# Patient Record
Sex: Female | Born: 1955 | Race: White | Hispanic: No | Marital: Married | Smoking: Current every day smoker
Health system: Southern US, Community
[De-identification: ages and names within clinical notes are randomized; demographics above are authoritative.]

## PROBLEM LIST (undated history)

## (undated) ENCOUNTER — Emergency Department (HOSPITAL_COMMUNITY): Admission: EM | Payer: Medicare Other | Source: Home / Self Care

## (undated) DIAGNOSIS — I10 Essential (primary) hypertension: Secondary | ICD-10-CM

## (undated) DIAGNOSIS — J449 Chronic obstructive pulmonary disease, unspecified: Secondary | ICD-10-CM

## (undated) DIAGNOSIS — E079 Disorder of thyroid, unspecified: Secondary | ICD-10-CM

## (undated) HISTORY — PX: CHOLECYSTECTOMY: SHX55

## (undated) HISTORY — PX: ABDOMINAL HYSTERECTOMY: SHX81

## (undated) HISTORY — PX: SHOULDER SURGERY: SHX246

---

## 2003-03-26 ENCOUNTER — Emergency Department (HOSPITAL_COMMUNITY): Admission: EM | Admit: 2003-03-26 | Discharge: 2003-03-26 | Payer: Self-pay | Admitting: *Deleted

## 2004-02-05 ENCOUNTER — Emergency Department (HOSPITAL_COMMUNITY): Admission: EM | Admit: 2004-02-05 | Discharge: 2004-02-05 | Payer: Self-pay | Admitting: Emergency Medicine

## 2004-05-21 ENCOUNTER — Emergency Department (HOSPITAL_COMMUNITY): Admission: EM | Admit: 2004-05-21 | Discharge: 2004-05-21 | Payer: Self-pay | Admitting: Emergency Medicine

## 2004-06-05 ENCOUNTER — Ambulatory Visit (HOSPITAL_COMMUNITY): Admission: RE | Admit: 2004-06-05 | Discharge: 2004-06-05 | Payer: Self-pay | Admitting: Family Medicine

## 2005-01-16 ENCOUNTER — Ambulatory Visit (HOSPITAL_COMMUNITY): Admission: RE | Admit: 2005-01-16 | Discharge: 2005-01-16 | Payer: Self-pay | Admitting: Family Medicine

## 2005-03-13 ENCOUNTER — Emergency Department (HOSPITAL_COMMUNITY): Admission: EM | Admit: 2005-03-13 | Discharge: 2005-03-14 | Payer: Self-pay | Admitting: Emergency Medicine

## 2005-10-28 ENCOUNTER — Encounter
Admission: RE | Admit: 2005-10-28 | Discharge: 2006-01-26 | Payer: Self-pay | Admitting: Physical Medicine & Rehabilitation

## 2006-01-02 ENCOUNTER — Ambulatory Visit: Payer: Self-pay | Admitting: Family Medicine

## 2006-05-05 ENCOUNTER — Ambulatory Visit: Payer: Self-pay | Admitting: Internal Medicine

## 2006-06-16 ENCOUNTER — Ambulatory Visit: Payer: Self-pay | Admitting: Internal Medicine

## 2006-11-22 ENCOUNTER — Emergency Department (HOSPITAL_COMMUNITY): Admission: EM | Admit: 2006-11-22 | Discharge: 2006-11-22 | Payer: Self-pay | Admitting: Emergency Medicine

## 2006-12-10 ENCOUNTER — Emergency Department (HOSPITAL_COMMUNITY): Admission: EM | Admit: 2006-12-10 | Discharge: 2006-12-10 | Payer: Self-pay | Admitting: Emergency Medicine

## 2006-12-17 ENCOUNTER — Emergency Department (HOSPITAL_COMMUNITY): Admission: EM | Admit: 2006-12-17 | Discharge: 2006-12-17 | Payer: Self-pay | Admitting: Emergency Medicine

## 2007-05-02 ENCOUNTER — Emergency Department (HOSPITAL_COMMUNITY): Admission: EM | Admit: 2007-05-02 | Discharge: 2007-05-02 | Payer: Self-pay | Admitting: Emergency Medicine

## 2009-04-27 ENCOUNTER — Emergency Department (HOSPITAL_COMMUNITY): Admission: EM | Admit: 2009-04-27 | Discharge: 2009-04-27 | Payer: Self-pay | Admitting: Emergency Medicine

## 2009-05-21 ENCOUNTER — Emergency Department (HOSPITAL_COMMUNITY): Admission: EM | Admit: 2009-05-21 | Discharge: 2009-05-21 | Payer: Self-pay | Admitting: Emergency Medicine

## 2009-06-27 ENCOUNTER — Emergency Department (HOSPITAL_COMMUNITY): Admission: EM | Admit: 2009-06-27 | Discharge: 2009-06-27 | Payer: Self-pay | Admitting: Emergency Medicine

## 2009-08-13 ENCOUNTER — Emergency Department (HOSPITAL_COMMUNITY): Admission: EM | Admit: 2009-08-13 | Discharge: 2009-08-13 | Payer: Self-pay | Admitting: Emergency Medicine

## 2009-10-08 ENCOUNTER — Emergency Department (HOSPITAL_COMMUNITY): Admission: EM | Admit: 2009-10-08 | Discharge: 2009-10-08 | Payer: Self-pay | Admitting: Emergency Medicine

## 2009-11-07 ENCOUNTER — Emergency Department (HOSPITAL_COMMUNITY): Admission: EM | Admit: 2009-11-07 | Discharge: 2009-11-07 | Payer: Self-pay | Admitting: Emergency Medicine

## 2009-12-13 ENCOUNTER — Emergency Department (HOSPITAL_COMMUNITY): Admission: EM | Admit: 2009-12-13 | Discharge: 2009-12-13 | Payer: Self-pay | Admitting: Emergency Medicine

## 2010-04-12 ENCOUNTER — Emergency Department (HOSPITAL_COMMUNITY): Admission: EM | Admit: 2010-04-12 | Discharge: 2010-04-12 | Payer: Self-pay | Admitting: Emergency Medicine

## 2010-05-05 ENCOUNTER — Emergency Department (HOSPITAL_COMMUNITY): Admission: EM | Admit: 2010-05-05 | Discharge: 2010-05-05 | Payer: Self-pay | Admitting: Emergency Medicine

## 2010-05-13 ENCOUNTER — Emergency Department (HOSPITAL_COMMUNITY): Admission: EM | Admit: 2010-05-13 | Discharge: 2010-05-13 | Payer: Self-pay | Admitting: Emergency Medicine

## 2010-05-30 ENCOUNTER — Emergency Department (HOSPITAL_COMMUNITY): Admission: EM | Admit: 2010-05-30 | Discharge: 2010-05-30 | Payer: Self-pay | Admitting: Emergency Medicine

## 2010-06-06 ENCOUNTER — Emergency Department (HOSPITAL_COMMUNITY): Admission: EM | Admit: 2010-06-06 | Discharge: 2010-06-06 | Payer: Self-pay | Admitting: Emergency Medicine

## 2010-06-10 ENCOUNTER — Emergency Department (HOSPITAL_COMMUNITY): Admission: EM | Admit: 2010-06-10 | Discharge: 2010-06-10 | Payer: Self-pay | Admitting: Emergency Medicine

## 2010-06-12 ENCOUNTER — Ambulatory Visit: Payer: Self-pay | Admitting: Oncology

## 2010-06-13 LAB — TECHNOLOGIST REVIEW: Technologist Review: NONE SEEN

## 2010-06-13 LAB — LACTATE DEHYDROGENASE: LDH: 158 U/L (ref 94–250)

## 2010-06-13 LAB — CBC WITH DIFFERENTIAL/PLATELET
BASO%: 0.2 % (ref 0.0–2.0)
EOS%: 0.5 % (ref 0.0–7.0)
LYMPH%: 13.8 % — ABNORMAL LOW (ref 14.0–49.7)
MCH: 22.1 pg — ABNORMAL LOW (ref 25.1–34.0)
MCHC: 31.3 g/dL — ABNORMAL LOW (ref 31.5–36.0)
MCV: 70.4 fL — ABNORMAL LOW (ref 79.5–101.0)
MONO%: 7.9 % (ref 0.0–14.0)
Platelets: 607 10*3/uL — ABNORMAL HIGH (ref 145–400)
RBC: 5.48 10*6/uL — ABNORMAL HIGH (ref 3.70–5.45)

## 2010-06-13 LAB — COMPREHENSIVE METABOLIC PANEL
ALT: 15 U/L (ref 0–35)
Alkaline Phosphatase: 87 U/L (ref 39–117)
CO2: 25 mEq/L (ref 19–32)
Creatinine, Ser: 0.66 mg/dL (ref 0.40–1.20)
Total Bilirubin: 1.2 mg/dL (ref 0.3–1.2)

## 2010-06-18 LAB — HEMOGLOBINOPATHY EVALUATION
Hemoglobin Other: 0 % (ref 0.0–0.0)
Hgb A: 94.3 % — ABNORMAL LOW (ref 96.8–97.8)
Hgb F Quant: 0.7 % (ref 0.0–2.0)
Hgb S Quant: 0 % (ref 0.0–0.0)

## 2010-06-18 LAB — IRON AND TIBC
Iron: 39 ug/dL — ABNORMAL LOW (ref 42–145)
UIBC: 278 ug/dL

## 2010-06-18 LAB — FERRITIN: Ferritin: 172 ng/mL (ref 10–291)

## 2010-06-30 ENCOUNTER — Emergency Department (HOSPITAL_COMMUNITY): Admission: EM | Admit: 2010-06-30 | Discharge: 2010-06-30 | Payer: Self-pay | Admitting: Emergency Medicine

## 2010-07-10 ENCOUNTER — Emergency Department (HOSPITAL_COMMUNITY): Admission: EM | Admit: 2010-07-10 | Discharge: 2010-07-10 | Payer: Self-pay | Admitting: Emergency Medicine

## 2010-08-02 ENCOUNTER — Emergency Department (HOSPITAL_COMMUNITY): Admission: EM | Admit: 2010-08-02 | Discharge: 2010-08-02 | Payer: Self-pay | Admitting: Emergency Medicine

## 2010-10-30 ENCOUNTER — Emergency Department (HOSPITAL_COMMUNITY): Admission: EM | Admit: 2010-10-30 | Discharge: 2010-10-30 | Payer: Self-pay | Admitting: Emergency Medicine

## 2010-12-22 ENCOUNTER — Emergency Department (HOSPITAL_COMMUNITY)
Admission: EM | Admit: 2010-12-22 | Discharge: 2010-12-22 | Payer: Self-pay | Source: Home / Self Care | Admitting: Emergency Medicine

## 2010-12-27 ENCOUNTER — Emergency Department (HOSPITAL_COMMUNITY)
Admission: EM | Admit: 2010-12-27 | Discharge: 2010-12-27 | Payer: Self-pay | Source: Home / Self Care | Admitting: Emergency Medicine

## 2011-01-13 ENCOUNTER — Encounter: Payer: Self-pay | Admitting: Family Medicine

## 2011-01-22 ENCOUNTER — Emergency Department (HOSPITAL_COMMUNITY)
Admission: EM | Admit: 2011-01-22 | Discharge: 2011-01-22 | Payer: Self-pay | Source: Home / Self Care | Admitting: Emergency Medicine

## 2011-03-31 LAB — RAPID STREP SCREEN (MED CTR MEBANE ONLY): Streptococcus, Group A Screen (Direct): NEGATIVE

## 2011-04-30 ENCOUNTER — Emergency Department (HOSPITAL_COMMUNITY)
Admission: EM | Admit: 2011-04-30 | Discharge: 2011-04-30 | Disposition: A | Discharge status: Home / Self Care | Payer: Medicare Other | Attending: Emergency Medicine | Admitting: Emergency Medicine

## 2011-04-30 DIAGNOSIS — E039 Hypothyroidism, unspecified: Secondary | ICD-10-CM | POA: Insufficient documentation

## 2011-04-30 DIAGNOSIS — Z79899 Other long term (current) drug therapy: Secondary | ICD-10-CM | POA: Insufficient documentation

## 2011-04-30 DIAGNOSIS — M109 Gout, unspecified: Secondary | ICD-10-CM | POA: Insufficient documentation

## 2011-04-30 DIAGNOSIS — IMO0002 Reserved for concepts with insufficient information to code with codable children: Secondary | ICD-10-CM | POA: Insufficient documentation

## 2011-04-30 DIAGNOSIS — I10 Essential (primary) hypertension: Secondary | ICD-10-CM | POA: Insufficient documentation

## 2011-04-30 DIAGNOSIS — F3289 Other specified depressive episodes: Secondary | ICD-10-CM | POA: Insufficient documentation

## 2011-04-30 DIAGNOSIS — F17200 Nicotine dependence, unspecified, uncomplicated: Secondary | ICD-10-CM | POA: Insufficient documentation

## 2011-04-30 DIAGNOSIS — IMO0001 Reserved for inherently not codable concepts without codable children: Secondary | ICD-10-CM | POA: Insufficient documentation

## 2011-04-30 DIAGNOSIS — G43909 Migraine, unspecified, not intractable, without status migrainosus: Secondary | ICD-10-CM | POA: Insufficient documentation

## 2011-04-30 DIAGNOSIS — F329 Major depressive disorder, single episode, unspecified: Secondary | ICD-10-CM | POA: Insufficient documentation

## 2011-05-12 ENCOUNTER — Emergency Department (HOSPITAL_COMMUNITY)
Admission: EM | Admit: 2011-05-12 | Discharge: 2011-05-12 | Disposition: A | Discharge status: Home / Self Care | Payer: Medicare Other | Attending: Emergency Medicine | Admitting: Emergency Medicine

## 2011-05-12 DIAGNOSIS — IMO0001 Reserved for inherently not codable concepts without codable children: Secondary | ICD-10-CM | POA: Insufficient documentation

## 2011-05-12 DIAGNOSIS — E039 Hypothyroidism, unspecified: Secondary | ICD-10-CM | POA: Insufficient documentation

## 2011-05-12 DIAGNOSIS — M109 Gout, unspecified: Secondary | ICD-10-CM | POA: Insufficient documentation

## 2011-05-12 DIAGNOSIS — I10 Essential (primary) hypertension: Secondary | ICD-10-CM | POA: Insufficient documentation

## 2011-05-12 DIAGNOSIS — IMO0002 Reserved for concepts with insufficient information to code with codable children: Secondary | ICD-10-CM | POA: Insufficient documentation

## 2011-05-12 DIAGNOSIS — Z79899 Other long term (current) drug therapy: Secondary | ICD-10-CM | POA: Insufficient documentation

## 2011-05-12 DIAGNOSIS — F3289 Other specified depressive episodes: Secondary | ICD-10-CM | POA: Insufficient documentation

## 2011-05-12 DIAGNOSIS — G43909 Migraine, unspecified, not intractable, without status migrainosus: Secondary | ICD-10-CM | POA: Insufficient documentation

## 2011-05-12 DIAGNOSIS — F329 Major depressive disorder, single episode, unspecified: Secondary | ICD-10-CM | POA: Insufficient documentation

## 2011-05-12 DIAGNOSIS — M25579 Pain in unspecified ankle and joints of unspecified foot: Secondary | ICD-10-CM | POA: Insufficient documentation

## 2011-06-01 ENCOUNTER — Emergency Department (HOSPITAL_COMMUNITY)
Admission: EM | Admit: 2011-06-01 | Discharge: 2011-06-01 | Disposition: A | Discharge status: Home / Self Care | Payer: Medicare Other | Attending: Emergency Medicine | Admitting: Emergency Medicine

## 2011-06-01 ENCOUNTER — Emergency Department (HOSPITAL_COMMUNITY): Payer: Medicare Other

## 2011-06-01 DIAGNOSIS — I10 Essential (primary) hypertension: Secondary | ICD-10-CM | POA: Insufficient documentation

## 2011-06-01 DIAGNOSIS — F3289 Other specified depressive episodes: Secondary | ICD-10-CM | POA: Insufficient documentation

## 2011-06-01 DIAGNOSIS — IMO0002 Reserved for concepts with insufficient information to code with codable children: Secondary | ICD-10-CM | POA: Insufficient documentation

## 2011-06-01 DIAGNOSIS — M25579 Pain in unspecified ankle and joints of unspecified foot: Secondary | ICD-10-CM | POA: Insufficient documentation

## 2011-06-01 DIAGNOSIS — E039 Hypothyroidism, unspecified: Secondary | ICD-10-CM | POA: Insufficient documentation

## 2011-06-01 DIAGNOSIS — M109 Gout, unspecified: Secondary | ICD-10-CM | POA: Insufficient documentation

## 2011-06-01 DIAGNOSIS — F329 Major depressive disorder, single episode, unspecified: Secondary | ICD-10-CM | POA: Insufficient documentation

## 2011-12-07 ENCOUNTER — Encounter: Payer: Self-pay | Admitting: *Deleted

## 2011-12-07 ENCOUNTER — Emergency Department (HOSPITAL_COMMUNITY)
Admission: EM | Admit: 2011-12-07 | Discharge: 2011-12-07 | Disposition: A | Discharge status: Home / Self Care | Payer: Medicare Other | Attending: Emergency Medicine | Admitting: Emergency Medicine

## 2011-12-07 DIAGNOSIS — I10 Essential (primary) hypertension: Secondary | ICD-10-CM | POA: Insufficient documentation

## 2011-12-07 DIAGNOSIS — M109 Gout, unspecified: Secondary | ICD-10-CM | POA: Insufficient documentation

## 2011-12-07 DIAGNOSIS — F17200 Nicotine dependence, unspecified, uncomplicated: Secondary | ICD-10-CM | POA: Insufficient documentation

## 2011-12-07 HISTORY — DX: Essential (primary) hypertension: I10

## 2011-12-07 HISTORY — DX: Disorder of thyroid, unspecified: E07.9

## 2011-12-07 MED ORDER — PREDNISONE 20 MG PO TABS
60.0000 mg | ORAL_TABLET | Freq: Once | ORAL | Status: AC
  Administered 2011-12-07: 60 mg via ORAL
  Filled 2011-12-07: qty 3

## 2011-12-07 MED ORDER — HYDROCODONE-ACETAMINOPHEN 5-325 MG PO TABS: 1.0000 | ORAL_TABLET | ORAL | Status: DC | PRN

## 2011-12-07 MED ORDER — PREDNISONE 20 MG PO TABS: 60.0000 mg | ORAL_TABLET | Freq: Every day | ORAL | Status: DC

## 2011-12-07 NOTE — ED Notes (Signed)
Pt a/ox4. Resp even and unlabored. NAD at this time. D/C instructions reviewed with pt. Pt verbalized understanding. Pt ambulated to lobby with steady gate.  

## 2011-12-07 NOTE — ED Notes (Signed)
Pt has a hx of gout, now c/o right big toe pain

## 2011-12-07 NOTE — ED Notes (Signed)
Pt a/ox4. resp even and unlabored. NAD at this time. D/C instructions reviewed with pt. Pt verbalized understanding. Pt ambulated to lobby with steady gate.  

## 2011-12-07 NOTE — ED Notes (Signed)
Pt presents with Right Great Toe pain and feels that this is a flare up of her gout. Pt ambulated with steady gate to treatment room. NAD at this time.

## 2011-12-08 NOTE — ED Provider Notes (Signed)
History/physical exam/procedure(s) were performed by non-physician practitioner and as supervising physician I was immediately available for consultation/collaboration. I have reviewed all notes and am in agreement with care and plan.   Hilario Quarry, MD 12/08/11 2112

## 2011-12-08 NOTE — ED Provider Notes (Signed)
History     CSN: [id number] Arrival date & time: 12/07/2011  7:46 PM   First MD Initiated Contact with Patient 12/07/11 1950      Chief Complaint  Patient presents with  . Gout    (Consider location/radiation/quality/duration/timing/severity/associated sxs/prior treatment) Patient is a 55 y.o. female presenting with toe pain. The history is provided by the patient.  Toe Pain This is a recurrent problem. The current episode started today. The problem occurs constantly. The problem has been gradually worsening. Associated symptoms include arthralgias. Pertinent negatives include no abdominal pain, chest pain, congestion, fever, headaches, joint swelling, myalgias, nausea, neck pain, numbness, rash, sore throat or weakness. Associated symptoms comments: She describes pain in her right great toe consistent with previous episodes of gout.  She woke with this pain today,  Which has steady worsened.  She denies injury.. Exacerbated by: flexing the toe,  palpation makes worse. She has tried nothing for the symptoms.    Past Medical History  Diagnosis Date  . Gout   . Hypertension   . Thyroid disease     Past Surgical History  Procedure Date  . Cholecystectomy   . Abdominal hysterectomy   . Shoulder surgery     History reviewed. No pertinent family history.  History  Substance Use Topics  . Smoking status: Current Everyday Smoker  . Smokeless tobacco: Not on file  . Alcohol Use: No    OB History    Grav Para Term Preterm Abortions TAB SAB Ect Mult Living                  Review of Systems  Constitutional: Negative for fever.  HENT: Negative for congestion, sore throat and neck pain.   Eyes: Negative.   Respiratory: Negative for chest tightness and shortness of breath.   Cardiovascular: Negative for chest pain.  Gastrointestinal: Negative for nausea and abdominal pain.  Genitourinary: Negative.   Musculoskeletal: Positive for arthralgias. Negative for myalgias and joint  swelling.  Skin: Negative.  Negative for rash and wound.  Neurological: Negative for dizziness, weakness, light-headedness, numbness and headaches.  Hematological: Negative.   Psychiatric/Behavioral: Negative.     Allergies  Nsaids and Tetanus toxoids  Home Medications   Current Outpatient Rx  Name Route Sig Dispense Refill  . HYDROCODONE-ACETAMINOPHEN 5-325 MG PO TABS Oral Take 1 tablet by mouth every 4 (four) hours as needed for pain. 15 tablet 0  . PREDNISONE 20 MG PO TABS Oral Take 3 tablets (60 mg total) by mouth daily. 15 tablet 0    BP 133/81  Temp 98.1 F (36.7 C)  Resp 20  Ht 5\' 2"  (1.575 m)  Wt 120 lb (54.432 kg)  BMI 21.95 kg/m2  SpO2 99%  Physical Exam  Nursing note and vitals reviewed. Constitutional: She is oriented to person, place, and time. She appears well-developed and well-nourished.  HENT:  Head: Normocephalic and atraumatic.  Eyes: Conjunctivae are normal.  Neck: Normal range of motion.  Cardiovascular: Normal rate, regular rhythm, normal heart sounds and intact distal pulses.   Pulmonary/Chest: Effort normal and breath sounds normal. She has no wheezes.  Abdominal: Soft. Bowel sounds are normal. There is no tenderness.  Musculoskeletal: Normal range of motion. She exhibits tenderness. She exhibits no edema.       Slight erythema at mtp joint,  No edema appreciated.  Neurological: She is alert and oriented to person, place, and time.  Skin: Skin is warm and dry.  Psychiatric: She has a normal mood and  affect.    ED Course  Procedures (including critical care time)  Labs Reviewed - No data to display No results found.   1. Gout attack       MDM  Prednisone,  Hydrocodone, elevation,  Warm compresses.   Flu pcp prn        Candis Musa, [place] 12/08/11 5409

## 2011-12-16 ENCOUNTER — Encounter (HOSPITAL_COMMUNITY): Payer: Self-pay | Admitting: *Deleted

## 2011-12-16 ENCOUNTER — Emergency Department (HOSPITAL_COMMUNITY)
Admission: EM | Admit: 2011-12-16 | Discharge: 2011-12-16 | Discharge status: Against Medical Advice | Payer: Medicare Other | Attending: Emergency Medicine | Admitting: Emergency Medicine

## 2011-12-16 DIAGNOSIS — R5381 Other malaise: Secondary | ICD-10-CM | POA: Insufficient documentation

## 2011-12-16 DIAGNOSIS — J029 Acute pharyngitis, unspecified: Secondary | ICD-10-CM | POA: Insufficient documentation

## 2011-12-16 DIAGNOSIS — R5383 Other fatigue: Secondary | ICD-10-CM | POA: Insufficient documentation

## 2011-12-16 DIAGNOSIS — M549 Dorsalgia, unspecified: Secondary | ICD-10-CM | POA: Insufficient documentation

## 2011-12-16 NOTE — ED Notes (Signed)
Pt c/o sore throat x 1 wk, weakness, and lower back pain. Denies uti symptoms.

## 2012-03-21 ENCOUNTER — Encounter (HOSPITAL_COMMUNITY): Payer: Self-pay | Admitting: *Deleted

## 2012-03-21 ENCOUNTER — Emergency Department (HOSPITAL_COMMUNITY)
Admission: EM | Admit: 2012-03-21 | Discharge: 2012-03-21 | Disposition: A | Discharge status: Home / Self Care | Payer: Medicare Other | Attending: Emergency Medicine | Admitting: Emergency Medicine

## 2012-03-21 DIAGNOSIS — I10 Essential (primary) hypertension: Secondary | ICD-10-CM | POA: Insufficient documentation

## 2012-03-21 DIAGNOSIS — Z9089 Acquired absence of other organs: Secondary | ICD-10-CM | POA: Insufficient documentation

## 2012-03-21 DIAGNOSIS — Z886 Allergy status to analgesic agent status: Secondary | ICD-10-CM | POA: Insufficient documentation

## 2012-03-21 DIAGNOSIS — M109 Gout, unspecified: Secondary | ICD-10-CM | POA: Insufficient documentation

## 2012-03-21 DIAGNOSIS — Z90710 Acquired absence of both cervix and uterus: Secondary | ICD-10-CM | POA: Insufficient documentation

## 2012-03-21 DIAGNOSIS — Z887 Allergy status to serum and vaccine status: Secondary | ICD-10-CM | POA: Insufficient documentation

## 2012-03-21 DIAGNOSIS — E079 Disorder of thyroid, unspecified: Secondary | ICD-10-CM | POA: Insufficient documentation

## 2012-03-21 DIAGNOSIS — F17200 Nicotine dependence, unspecified, uncomplicated: Secondary | ICD-10-CM | POA: Insufficient documentation

## 2012-03-21 MED ORDER — PREDNISONE 10 MG PO TABS: ORAL_TABLET | ORAL | Status: DC

## 2012-03-21 MED ORDER — HYDROCODONE-ACETAMINOPHEN 5-325 MG PO TABS: 1.0000 | ORAL_TABLET | ORAL | Status: AC | PRN

## 2012-03-21 NOTE — Discharge Instructions (Signed)
Gout Gout is an inflammatory condition (arthritis) caused by a buildup of uric acid crystals in the joints. Uric acid is a chemical that is normally present in the blood. Under some circumstances, uric acid can form into crystals in your joints. This causes joint redness, soreness, and swelling (inflammation). Repeat attacks are common. Over time, uric acid crystals can form into masses (tophi) near a joint, causing disfigurement. Gout is treatable and often preventable. CAUSES  The disease begins with elevated levels of uric acid in the blood. Uric acid is produced by your body when it breaks down a naturally found substance called purines. This also happens when you eat certain foods such as meats and fish. Causes of an elevated uric acid level include:  Being passed down from parent to child (heredity).   Diseases that cause increased uric acid production (obesity, psoriasis, some cancers).   Excessive alcohol use.   Diet, especially diets rich in meat and seafood.   Medicines, including certain cancer-fighting drugs (chemotherapy), diuretics, and aspirin.   Chronic kidney disease. The kidneys are no longer able to remove uric acid well.   Problems with metabolism.  Conditions strongly associated with gout include:  Obesity.   High blood pressure.   High cholesterol.   Diabetes.  Not everyone with elevated uric acid levels gets gout. It is not understood why some people get gout and others do not. Surgery, joint injury, and eating too much of certain foods are some of the factors that can lead to gout. SYMPTOMS   An attack of gout comes on quickly. It causes intense pain with redness, swelling, and warmth in a joint.   Fever can occur.   Often, only one joint is involved. Certain joints are more commonly involved:   Base of the big toe.   Knee.   Ankle.   Wrist.   Finger.  Without treatment, an attack usually goes away in a few days to weeks. Between attacks, you  usually will not have symptoms, which is different from many other forms of arthritis. DIAGNOSIS  Your caregiver will suspect gout based on your symptoms and exam. Removal of fluid from the joint (arthrocentesis) is done to check for uric acid crystals. Your caregiver will give you a medicine that numbs the area (local anesthetic) and use a needle to remove joint fluid for exam. Gout is confirmed when uric acid crystals are seen in joint fluid, using a special microscope. Sometimes, blood, urine, and X-ray tests are also used. TREATMENT  There are 2 phases to gout treatment: treating the sudden onset (acute) attack and preventing attacks (prophylaxis). Treatment of an Acute Attack  Medicines are used. These include anti-inflammatory medicines or steroid medicines.   An injection of steroid medicine into the affected joint is sometimes necessary.   The painful joint is rested. Movement can worsen the arthritis.   You may use warm or cold treatments on painful joints, depending which works best for you.   Discuss the use of coffee, vitamin C, or cherries with your caregiver. These may be helpful treatment options.  Treatment to Prevent Attacks After the acute attack subsides, your caregiver may advise prophylactic medicine. These medicines either help your kidneys eliminate uric acid from your body or decrease your uric acid production. You may need to stay on these medicines for a very long time. The early phase of treatment with prophylactic medicine can be associated with an increase in acute gout attacks. For this reason, during the first few months  of treatment, your caregiver may also advise you to take medicines usually used for acute gout treatment. Be sure you understand your caregiver's directions. You should also discuss dietary treatment with your caregiver. Certain foods such as meats and fish can increase uric acid levels. Other foods such as dairy can decrease levels. Your caregiver  can give you a list of foods to avoid. HOME CARE INSTRUCTIONS   Do not take aspirin to relieve pain. This raises uric acid levels.   Only take over-the-counter or prescription medicines for pain, discomfort, or fever as directed by your caregiver.   Rest the joint as much as possible. When in bed, keep sheets and blankets off painful areas.   Keep the affected joint raised (elevated).   Use crutches if the painful joint is in your leg.   Drink enough water and fluids to keep your urine clear or pale yellow. This helps your body get rid of uric acid. Do not drink alcoholic beverages. They slow the passage of uric acid.   Follow your caregiver's dietary instructions. Pay careful attention to the amount of protein you eat. Your daily diet should emphasize fruits, vegetables, whole grains, and fat-free or low-fat milk products.   Maintain a healthy body weight.  SEEK MEDICAL CARE IF:   You have an oral temperature above 102 F (38.9 C).   You develop diarrhea, vomiting, or any side effects from medicines.   You do not feel better in 24 hours, or you are getting worse.  SEEK IMMEDIATE MEDICAL CARE IF:   Your joint becomes suddenly more tender and you have:   Chills.   An oral temperature above 102 F (38.9 C), not controlled by medicine.  MAKE SURE YOU:   Understand these instructions.   Will watch your condition.   Will get help right away if you are not doing well or get worse.  Document Released: 12/06/2000 Document Revised: 11/28/2011 Document Reviewed: 03/19/2010 [facility] Patient Information 2012 [place], [place].    You may take the hydrocodone prescribed for pain relief.  This will make you drowsy - do not drive within 4 hours of taking this medication.  See your Dr. for recheck if not improving over the next one to 2 days.  Elevation and warm compresses may also help relieve your symptoms quicker.

## 2012-03-21 NOTE — ED Provider Notes (Signed)
History     CSN: [id number]  Arrival date & time 03/21/12  1523   First MD Initiated Contact with Patient 03/21/12 1536      Chief Complaint  Patient presents with  . Gout    (Consider location/radiation/quality/duration/timing/severity/associated sxs/prior treatment) HPI Comments: Patient presents for treatment of a gouty flareup, reporting she woke up today with increasing pain in her right great toe which she recognizes as a prodrome to a worsened to gouty flare.  Her last episode of this occurred about 3 months ago.  She denies any injuries to her toe.  She states that prednisone works the best for these flareups.  She denies fevers or chills.  She has not had any medications today for treatment of her pain.  Pain is constant worsened by walking and palpation.  Relieved by nothing.  She denies fevers or chills, no nausea or vomiting.  The history is provided by the patient.    Past Medical History  Diagnosis Date  . Gout   . Hypertension   . Thyroid disease     Past Surgical History  Procedure Date  . Cholecystectomy   . Abdominal hysterectomy   . Shoulder surgery     History reviewed. No pertinent family history.  History  Substance Use Topics  . Smoking status: Current Everyday Smoker -- 1.0 packs/day    Types: Cigarettes  . Smokeless tobacco: Not on file  . Alcohol Use: No    OB History    Grav Para Term Preterm Abortions TAB SAB Ect Mult Living                  Review of Systems  Constitutional: Negative for fever.  HENT: Negative for congestion, sore throat and neck pain.   Eyes: Negative.   Respiratory: Negative for chest tightness and shortness of breath.   Cardiovascular: Negative for chest pain.  Gastrointestinal: Negative for nausea and abdominal pain.  Genitourinary: Negative.   Musculoskeletal: Positive for joint swelling and arthralgias.  Skin: Negative.  Negative for color change, rash and wound.  Neurological: Negative for dizziness,  weakness, light-headedness, numbness and headaches.  Hematological: Negative.   Psychiatric/Behavioral: Negative.     Allergies  Nsaids and Tetanus toxoids  Home Medications   Current Outpatient Rx  Name Route Sig Dispense Refill  . HYDROCODONE-ACETAMINOPHEN 5-325 MG PO TABS Oral Take 1 tablet by mouth every 4 (four) hours as needed for pain. 15 tablet 0  . PREDNISONE 10 MG PO TABS  6, 5, 4, 3, 2 then 1 tablet by mouth daily for 6 days total. 21 tablet 0    BP 127/75  Pulse 88  Temp(Src) 97.4 F (36.3 C) (Oral)  Resp 16  Ht 5\' 2"  (1.575 m)  Wt 120 lb (54.432 kg)  BMI 21.95 kg/m2  SpO2 97%  Physical Exam  Nursing note and vitals reviewed. Constitutional: She is oriented to person, place, and time. She appears well-developed and well-nourished.  HENT:  Head: Normocephalic.  Eyes: Conjunctivae are normal.  Neck: Normal range of motion.  Cardiovascular: Normal rate and intact distal pulses.  Exam reveals no decreased pulses.   Pulses:      Dorsalis pedis pulses are 2+ on the right side, and 2+ on the left side.       Posterior tibial pulses are 2+ on the right side, and 2+ on the left side.  Pulmonary/Chest: Effort normal.  Musculoskeletal: She exhibits edema and tenderness.       Right foot: She  exhibits tenderness and swelling.       Feet:       No red streaking, slight erythema to the skin at PIP joint.  No abrasions or skin wounds present.  Neurological: She is alert and oriented to person, place, and time. No sensory deficit.  Skin: Skin is warm, dry and intact.    ED Course  Procedures (including critical care time)  Labs Reviewed - No data to display No results found.   1. Gout attack       MDM  Exam and history consistent with gouty flare.  Prednisone 2 mg of 10 mg taper prescribed, hydrocodone when necessary pain.  Elevation, warm compresses.  Recheck by PCP if not improving over the next 48 hours.        Candis Musa, PA 03/21/12 1549

## 2012-03-21 NOTE — ED Notes (Signed)
C/o "gout flare up" to right great toe, denies taking meds for gout

## 2012-03-21 NOTE — ED Provider Notes (Signed)
Medical screening examination/treatment/procedure(s) were performed by non-physician practitioner and as supervising physician I was immediately available for consultation/collaboration.  Kristof Nadeem, MD 03/21/12 1908 

## 2012-09-06 ENCOUNTER — Emergency Department (HOSPITAL_COMMUNITY)
Admission: EM | Admit: 2012-09-06 | Discharge: 2012-09-06 | Disposition: A | Discharge status: Home / Self Care | Payer: Medicare Other | Attending: Emergency Medicine | Admitting: Emergency Medicine

## 2012-09-06 ENCOUNTER — Encounter (HOSPITAL_COMMUNITY): Payer: Self-pay | Admitting: Emergency Medicine

## 2012-09-06 DIAGNOSIS — I10 Essential (primary) hypertension: Secondary | ICD-10-CM | POA: Insufficient documentation

## 2012-09-06 DIAGNOSIS — J3489 Other specified disorders of nose and nasal sinuses: Secondary | ICD-10-CM | POA: Insufficient documentation

## 2012-09-06 DIAGNOSIS — Z888 Allergy status to other drugs, medicaments and biological substances status: Secondary | ICD-10-CM | POA: Insufficient documentation

## 2012-09-06 DIAGNOSIS — M62838 Other muscle spasm: Secondary | ICD-10-CM | POA: Insufficient documentation

## 2012-09-06 DIAGNOSIS — F17200 Nicotine dependence, unspecified, uncomplicated: Secondary | ICD-10-CM | POA: Insufficient documentation

## 2012-09-06 DIAGNOSIS — R0981 Nasal congestion: Secondary | ICD-10-CM

## 2012-09-06 MED ORDER — HYDROCODONE-ACETAMINOPHEN 5-325 MG PO TABS: 1.0000 | ORAL_TABLET | ORAL | Status: AC | PRN

## 2012-09-06 MED ORDER — METHOCARBAMOL 500 MG PO TABS: 1000.0000 mg | ORAL_TABLET | Freq: Four times a day (QID) | ORAL | Status: AC

## 2012-09-06 NOTE — ED Notes (Signed)
Pt c/o pain to r side of neck, feels like pinching pain. Denies radiation. Nad. Able to turn head but with pain. Nad.
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
## 2012-09-06 NOTE — ED Provider Notes (Signed)
Medical screening examination/treatment/procedure(s) were performed by non-physician practitioner and as supervising physician I was immediately available for consultation/collaboration. Devoria Albe, MD, Armando Gang   Ward Givens, MD 09/06/12 [phone]
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
## 2012-11-04 ENCOUNTER — Encounter (HOSPITAL_COMMUNITY): Payer: Self-pay | Admitting: *Deleted

## 2012-11-04 ENCOUNTER — Emergency Department (HOSPITAL_COMMUNITY)
Admission: EM | Admit: 2012-11-04 | Discharge: 2012-11-04 | Disposition: A | Discharge status: Home / Self Care | Payer: Medicare Other | Attending: Emergency Medicine | Admitting: Emergency Medicine

## 2012-11-04 DIAGNOSIS — Y9389 Activity, other specified: Secondary | ICD-10-CM | POA: Insufficient documentation

## 2012-11-04 DIAGNOSIS — E079 Disorder of thyroid, unspecified: Secondary | ICD-10-CM | POA: Insufficient documentation

## 2012-11-04 DIAGNOSIS — Y929 Unspecified place or not applicable: Secondary | ICD-10-CM | POA: Insufficient documentation

## 2012-11-04 DIAGNOSIS — X500XXA Overexertion from strenuous movement or load, initial encounter: Secondary | ICD-10-CM | POA: Insufficient documentation

## 2012-11-04 DIAGNOSIS — I10 Essential (primary) hypertension: Secondary | ICD-10-CM | POA: Insufficient documentation

## 2012-11-04 DIAGNOSIS — S335XXA Sprain of ligaments of lumbar spine, initial encounter: Secondary | ICD-10-CM | POA: Insufficient documentation

## 2012-11-04 DIAGNOSIS — S39012A Strain of muscle, fascia and tendon of lower back, initial encounter: Secondary | ICD-10-CM

## 2012-11-04 DIAGNOSIS — F17200 Nicotine dependence, unspecified, uncomplicated: Secondary | ICD-10-CM | POA: Insufficient documentation

## 2012-11-04 DIAGNOSIS — Z79899 Other long term (current) drug therapy: Secondary | ICD-10-CM | POA: Insufficient documentation

## 2012-11-04 MED ORDER — METHOCARBAMOL 500 MG PO TABS: ORAL_TABLET | ORAL | Status: DC

## 2012-11-04 MED ORDER — HYDROCODONE-ACETAMINOPHEN 5-325 MG PO TABS: 1.0000 | ORAL_TABLET | ORAL | Status: DC | PRN

## 2012-11-04 NOTE — ED Provider Notes (Signed)
Medical screening examination/treatment/procedure(s) were performed by non-physician practitioner and as supervising physician I was immediately available for consultation/collaboration.   Lyanne Co, MD 11/04/12 [phone]

## 2012-11-04 NOTE — ED Provider Notes (Signed)
History     CSN: [id number]  Arrival date & time 11/04/12  1546   First MD Initiated Contact with Patient 11/04/12 1607      Chief Complaint  Patient presents with  . Back Pain    (Consider location/radiation/quality/duration/timing/severity/associated sxs/prior treatment) Patient is a 56 y.o. female presenting with back pain. The history is provided by the patient.  Back Pain  This is a new problem. The current episode started 2 days ago. The problem occurs daily. The problem has been gradually worsening. The pain is associated with lifting heavy objects. The pain is present in the lumbar spine. The quality of the pain is described as aching and cramping. The pain does not radiate. The pain is moderate. The symptoms are aggravated by certain positions. The pain is the same all the time. Pertinent negatives include no chest pain, no numbness, no abdominal pain, no bowel incontinence, no perianal numbness, no bladder incontinence and no dysuria. She has tried analgesics for the symptoms. The treatment provided no relief.    Past Medical History  Diagnosis Date  . Gout   . Hypertension   . Thyroid disease     Past Surgical History  Procedure Date  . Cholecystectomy   . Abdominal hysterectomy   . Shoulder surgery     History reviewed. No pertinent family history.  History  Substance Use Topics  . Smoking status: Current Every Day Smoker -- 1.0 packs/day    Types: Cigarettes  . Smokeless tobacco: Not on file  . Alcohol Use: No    OB History    Grav Para Term Preterm Abortions TAB SAB Ect Mult Living                  Review of Systems  Constitutional: Negative for activity change.       All ROS Neg except as noted in HPI  HENT: Negative for nosebleeds and neck pain.   Eyes: Negative for photophobia and discharge.  Respiratory: Negative for cough, shortness of breath and wheezing.   Cardiovascular: Negative for chest pain and palpitations.  Gastrointestinal:  Negative for abdominal pain, blood in stool and bowel incontinence.  Genitourinary: Negative for bladder incontinence, dysuria, frequency and hematuria.  Musculoskeletal: Positive for back pain. Negative for arthralgias.  Skin: Negative.   Neurological: Negative for dizziness, seizures, speech difficulty and numbness.  Psychiatric/Behavioral: Negative for hallucinations and confusion.    Allergies  Tetanus toxoids and Nsaids  Home Medications   Current Outpatient Rx  Name  Route  Sig  Dispense  Refill  . ACETAMINOPHEN 500 MG PO TABS   Oral   Take 1,000 mg by mouth every 6 (six) hours as needed. For pain         . ALBUTEROL SULFATE HFA 108 (90 BASE) MCG/ACT IN AERS   Inhalation   Inhale 2 puffs into the lungs every 6 (six) hours as needed. For shortness of breath         . AMLODIPINE BESYLATE 5 MG PO TABS   Oral   Take 5 mg by mouth daily.         Marland Kitchen LEVOTHYROXINE SODIUM 100 MCG PO TABS   Oral   Take 100 mcg by mouth daily.         . ADULT MULTIVITAMIN W/MINERALS CH   Oral   Take 1 tablet by mouth daily.           BP 116/101  Pulse 94  Temp 97.9 F (36.6 C) (Oral)  Resp 20  Ht 5\' 2"  (1.575 m)  Wt 112 lb 3 oz (50.888 kg)  BMI 20.52 kg/m2  SpO2 98%  Physical Exam  Nursing note and vitals reviewed. Constitutional: She is oriented to person, place, and time. She appears well-developed and well-nourished.  Non-toxic appearance.  HENT:  Head: Normocephalic.  Right Ear: Tympanic membrane and external ear normal.  Left Ear: Tympanic membrane and external ear normal.  Eyes: EOM and lids are normal. Pupils are equal, round, and reactive to light.  Neck: Normal range of motion. Neck supple. Carotid bruit is not present.  Cardiovascular: Normal rate, regular rhythm, normal heart sounds, intact distal pulses and normal pulses.   Pulmonary/Chest: Breath sounds normal. No respiratory distress.  Abdominal: Soft. Bowel sounds are normal. There is no tenderness. There  is no guarding.  Musculoskeletal: Normal range of motion.       Decrease ROM of the lumbar area. Pain to palpation of the lumbar paraspinal area. No hot areas. No palpable step off.  Lymphadenopathy:       Head (right side): No submandibular adenopathy present.       Head (left side): No submandibular adenopathy present.    She has no cervical adenopathy.  Neurological: She is alert and oriented to person, place, and time. She has normal strength. No cranial nerve deficit or sensory deficit. She exhibits normal muscle tone. Coordination normal.  Skin: Skin is warm and dry.  Psychiatric: Her speech is normal. Her mood appears anxious.    ED Course  Procedures (including critical care time)  Labs Reviewed - No data to display No results found.   No diagnosis found.    MDM  I have reviewed nursing notes, vital signs, and all appropriate lab and imaging results for this patient. Exam reveal muscle strain of the lumbar area. Pt advised to use heat and rest. Rx for Robaxin and Norco given to the patient.      Kathie Dike, [place] 11/04/12 1705

## 2012-11-04 NOTE — ED Notes (Signed)
Low back pain for 2 days after moving storage boxes

## 2012-12-22 ENCOUNTER — Emergency Department (HOSPITAL_COMMUNITY)
Admission: EM | Admit: 2012-12-22 | Discharge: 2012-12-22 | Disposition: A | Discharge status: Home / Self Care | Payer: Medicare Other | Attending: Emergency Medicine | Admitting: Emergency Medicine

## 2012-12-22 ENCOUNTER — Encounter (HOSPITAL_COMMUNITY): Payer: Self-pay | Admitting: *Deleted

## 2012-12-22 DIAGNOSIS — M549 Dorsalgia, unspecified: Secondary | ICD-10-CM | POA: Insufficient documentation

## 2012-12-22 DIAGNOSIS — I10 Essential (primary) hypertension: Secondary | ICD-10-CM | POA: Insufficient documentation

## 2012-12-22 DIAGNOSIS — F17200 Nicotine dependence, unspecified, uncomplicated: Secondary | ICD-10-CM | POA: Insufficient documentation

## 2012-12-22 DIAGNOSIS — E079 Disorder of thyroid, unspecified: Secondary | ICD-10-CM | POA: Insufficient documentation

## 2012-12-22 DIAGNOSIS — M79676 Pain in unspecified toe(s): Secondary | ICD-10-CM

## 2012-12-22 DIAGNOSIS — Z79899 Other long term (current) drug therapy: Secondary | ICD-10-CM | POA: Insufficient documentation

## 2012-12-22 DIAGNOSIS — M79609 Pain in unspecified limb: Secondary | ICD-10-CM | POA: Insufficient documentation

## 2012-12-22 DIAGNOSIS — M109 Gout, unspecified: Secondary | ICD-10-CM | POA: Insufficient documentation

## 2012-12-22 MED ORDER — COLCHICINE 0.6 MG PO TABS: 0.6000 mg | ORAL_TABLET | Freq: Every day | ORAL | Status: DC

## 2012-12-22 MED ORDER — METHYLPREDNISOLONE SODIUM SUCC 125 MG IJ SOLR
125.0000 mg | Freq: Once | INTRAMUSCULAR | Status: DC
  Filled 2012-12-22: qty 2

## 2012-12-22 MED ORDER — HYDROCODONE-ACETAMINOPHEN 5-325 MG PO TABS: ORAL_TABLET | ORAL | Status: DC

## 2012-12-22 MED ORDER — PREDNISONE 10 MG PO TABS: ORAL_TABLET | ORAL | Status: DC

## 2012-12-22 NOTE — ED Notes (Signed)
Pt has hx of gout and feels like she is having a flare up in rt foot that started around 0300 today

## 2012-12-22 NOTE — ED Provider Notes (Signed)
History     CSN: [id number]  Arrival date & time 12/22/12  1511   First MD Initiated Contact with Patient 12/22/12 1610      Chief Complaint  Patient presents with  . Foot Pain    "gout flare up" rt foot    (Consider location/radiation/quality/duration/timing/severity/associated sxs/prior treatment) Patient is a 56 y.o. female presenting with lower extremity pain. The history is provided by the patient.  Foot Pain This is a recurrent problem. The current episode started today. The problem occurs constantly. The problem has been gradually worsening. Associated symptoms include arthralgias. Pertinent negatives include no abdominal pain, chest pain, coughing, neck pain or numbness. Associated symptoms comments: Right 1st toe pain. The symptoms are aggravated by standing and walking. She has tried acetaminophen for the symptoms. The treatment provided no relief.    Past Medical History  Diagnosis Date  . Gout   . Hypertension   . Thyroid disease     Past Surgical History  Procedure Date  . Cholecystectomy   . Abdominal hysterectomy   . Shoulder surgery     History reviewed. No pertinent family history.  History  Substance Use Topics  . Smoking status: Current Every Day Smoker -- 1.0 packs/day    Types: Cigarettes  . Smokeless tobacco: Not on file  . Alcohol Use: No    OB History    Grav Para Term Preterm Abortions TAB SAB Ect Mult Living                  Review of Systems  Constitutional: Negative for activity change.       All ROS Neg except as noted in HPI  HENT: Negative for nosebleeds and neck pain.   Eyes: Negative for photophobia and discharge.  Respiratory: Negative for cough, shortness of breath and wheezing.   Cardiovascular: Negative for chest pain and palpitations.  Gastrointestinal: Negative for abdominal pain and blood in stool.  Genitourinary: Negative for dysuria, frequency and hematuria.  Musculoskeletal: Positive for back pain and arthralgias.   Skin: Negative.   Neurological: Negative for dizziness, seizures, speech difficulty and numbness.  Psychiatric/Behavioral: Negative for hallucinations and confusion.    Allergies  Tetanus toxoids and Nsaids  Home Medications   Current Outpatient Rx  Name  Route  Sig  Dispense  Refill  . ACETAMINOPHEN 500 MG PO TABS   Oral   Take 1,000 mg by mouth every 6 (six) hours as needed. For pain         . ALBUTEROL SULFATE HFA 108 (90 BASE) MCG/ACT IN AERS   Inhalation   Inhale 2 puffs into the lungs every 6 (six) hours as needed. For shortness of breath         . AMLODIPINE BESYLATE 5 MG PO TABS   Oral   Take 5 mg by mouth every evening.          Marland Kitchen LEVOTHYROXINE SODIUM 100 MCG PO TABS   Oral   Take 100 mcg by mouth every evening.          Marland Kitchen LORAZEPAM 1 MG PO TABS   Oral   Take 1 mg by mouth 2 (two) times daily.           BP 122/80  Pulse 92  Temp 97.9 F (36.6 C) (Oral)  Resp 20  Ht 5\' 2"  (1.575 m)  Wt 107 lb (48.535 kg)  BMI 19.57 kg/m2  SpO2 98%  Physical Exam  Nursing note and vitals reviewed. Constitutional: She is  oriented to person, place, and time. She appears well-developed and well-nourished.  Non-toxic appearance.  HENT:  Head: Normocephalic.  Right Ear: Tympanic membrane and external ear normal.  Left Ear: Tympanic membrane and external ear normal.  Eyes: EOM and lids are normal. Pupils are equal, round, and reactive to light.  Neck: Normal range of motion. Neck supple. Carotid bruit is not present.  Cardiovascular: Normal rate, regular rhythm, normal heart sounds, intact distal pulses and normal pulses.   Pulmonary/Chest: Breath sounds normal. No respiratory distress.  Abdominal: Soft. Bowel sounds are normal. There is no tenderness. There is no guarding.  Musculoskeletal: Normal range of motion.       Pain to palpation of the right 1st toe. No swelling or redness. Cap refill less than 3 sec. No lesions between toes. No plantar surface puncture  noted. DP and PT pulses 2 plus.  Lymphadenopathy:       Head (right side): No submandibular adenopathy present.       Head (left side): No submandibular adenopathy present.    She has no cervical adenopathy.  Neurological: She is alert and oriented to person, place, and time. She has normal strength. No cranial nerve deficit or sensory deficit.  Skin: Skin is warm and dry.  Psychiatric: She has a normal mood and affect. Her speech is normal.    ED Course  Procedures (including critical care time)  Labs Reviewed - No data to display No results found.   No diagnosis found.    MDM  I have reviewed nursing notes, vital signs, and all appropriate lab and imaging results for this patient. No neurovascular deficits noted on exam. Pt ambulatory, but with some discomfort. Attempted to treat pt in ED with IM solumedrol, but pt refuses. Rx for prednisone taper and colchicine ordered for the patient. Pt to see her PCP for additional evaluation.       Kathie Dike, [place] 12/22/12 [phone]

## 2012-12-22 NOTE — ED Notes (Signed)
Pt refused solu-medrol injection. EDPa Beverely Pace informed.

## 2012-12-25 NOTE — ED Provider Notes (Signed)
Medical screening examination/treatment/procedure(s) were performed by non-physician practitioner and as supervising physician I was immediately available for consultation/collaboration.  Jeriann Sayres, MD 12/25/12 0155 

## 2013-01-01 ENCOUNTER — Emergency Department (HOSPITAL_COMMUNITY)
Admission: EM | Admit: 2013-01-01 | Discharge: 2013-01-01 | Disposition: A | Discharge status: Home / Self Care | Payer: Medicare Other | Attending: Emergency Medicine | Admitting: Emergency Medicine

## 2013-01-01 ENCOUNTER — Encounter (HOSPITAL_COMMUNITY): Payer: Self-pay | Admitting: Emergency Medicine

## 2013-01-01 ENCOUNTER — Emergency Department (HOSPITAL_COMMUNITY): Payer: Medicare Other

## 2013-01-01 DIAGNOSIS — R059 Cough, unspecified: Secondary | ICD-10-CM | POA: Insufficient documentation

## 2013-01-01 DIAGNOSIS — Y929 Unspecified place or not applicable: Secondary | ICD-10-CM | POA: Insufficient documentation

## 2013-01-01 DIAGNOSIS — I10 Essential (primary) hypertension: Secondary | ICD-10-CM | POA: Insufficient documentation

## 2013-01-01 DIAGNOSIS — R05 Cough: Secondary | ICD-10-CM | POA: Insufficient documentation

## 2013-01-01 DIAGNOSIS — S2239XA Fracture of one rib, unspecified side, initial encounter for closed fracture: Secondary | ICD-10-CM | POA: Insufficient documentation

## 2013-01-01 DIAGNOSIS — M109 Gout, unspecified: Secondary | ICD-10-CM | POA: Insufficient documentation

## 2013-01-01 DIAGNOSIS — M549 Dorsalgia, unspecified: Secondary | ICD-10-CM | POA: Insufficient documentation

## 2013-01-01 DIAGNOSIS — Y9389 Activity, other specified: Secondary | ICD-10-CM | POA: Insufficient documentation

## 2013-01-01 DIAGNOSIS — S2232XA Fracture of one rib, left side, initial encounter for closed fracture: Secondary | ICD-10-CM

## 2013-01-01 DIAGNOSIS — E079 Disorder of thyroid, unspecified: Secondary | ICD-10-CM | POA: Insufficient documentation

## 2013-01-01 DIAGNOSIS — F17200 Nicotine dependence, unspecified, uncomplicated: Secondary | ICD-10-CM | POA: Insufficient documentation

## 2013-01-01 DIAGNOSIS — Z79899 Other long term (current) drug therapy: Secondary | ICD-10-CM | POA: Insufficient documentation

## 2013-01-01 DIAGNOSIS — X58XXXA Exposure to other specified factors, initial encounter: Secondary | ICD-10-CM | POA: Insufficient documentation

## 2013-01-01 MED ORDER — OXYCODONE-ACETAMINOPHEN 5-325 MG PO TABS: 1.0000 | ORAL_TABLET | ORAL | Status: DC | PRN

## 2013-01-01 NOTE — Progress Notes (Signed)
Instructed patient on use of IS, which was ordered Q1WA. Patient was able to demonstrate her understanding.  10 x 250 mL strong non-productive cough followed.

## 2013-01-01 NOTE — ED Notes (Signed)
Pt states diagnosed bronchitis from PCP and has had severe coughing. Pt states coughed yesterday and hurt left side rib area. Pt states hurts to breath, move or take a deep breath.

## 2013-01-01 NOTE — ED Provider Notes (Signed)
History     CSN: [id number]  Arrival date & time 01/01/13  0800   First MD Initiated Contact with Patient 01/01/13 [phone]      Chief Complaint  Patient presents with  . Rib Injury    (Consider location/radiation/quality/duration/timing/severity/associated sxs/prior treatment) HPI Comments: Priscilla Lindsey is a 57 y.o. Female who presents with sudden onset of left upper posterior back pain after an episode of coughing yesterday.  She was diagnosed with an acute bronchitis by her PCP earlier this week and is currently taking doxycycline for this.  She describes a cough which has been persistent and productive of clear sputum.  She denies wheezing and shortness of breath and also denies chest pain, although the pain in her back radiates around to her left axillary area with deep inspiration.  Pain is also worse with movement and direct palpation.  She denies fevers and chills.     The history is provided by the patient.    Past Medical History  Diagnosis Date  . Gout   . Hypertension   . Thyroid disease     Past Surgical History  Procedure Date  . Cholecystectomy   . Abdominal hysterectomy   . Shoulder surgery     History reviewed. No pertinent family history.  History  Substance Use Topics  . Smoking status: Current Every Day Smoker -- 1.0 packs/day    Types: Cigarettes  . Smokeless tobacco: Not on file  . Alcohol Use: No    OB History    Grav Para Term Preterm Abortions TAB SAB Ect Mult Living                  Review of Systems  Constitutional: Negative for fever and chills.  HENT: Negative for congestion, sore throat and neck pain.   Eyes: Negative.   Respiratory: Positive for cough. Negative for chest tightness, shortness of breath and wheezing.   Cardiovascular: Negative for chest pain, palpitations and leg swelling.  Gastrointestinal: Negative for nausea and abdominal pain.  Genitourinary: Negative.   Musculoskeletal: Positive for back pain. Negative for  joint swelling and arthralgias.  Skin: Negative.  Negative for rash and wound.  Neurological: Negative for dizziness, weakness, light-headedness, numbness and headaches.  Hematological: Negative.   Psychiatric/Behavioral: Negative.     Allergies  Tetanus toxoids and Nsaids  Home Medications   Current Outpatient Rx  Name  Route  Sig  Dispense  Refill  . ACETAMINOPHEN 500 MG PO TABS   Oral   Take 1,000 mg by mouth every 6 (six) hours as needed. For pain         . ALBUTEROL SULFATE HFA 108 (90 BASE) MCG/ACT IN AERS   Inhalation   Inhale 2 puffs into the lungs every 6 (six) hours as needed. For shortness of breath         . AMLODIPINE BESYLATE 5 MG PO TABS   Oral   Take 5 mg by mouth every evening.          Marland Kitchen HYDROCODONE-ACETAMINOPHEN 5-325 MG PO TABS   Oral   Take 1-2 tablets by mouth every 4 (four) hours as needed. Pain.         Marland Kitchen LEVOTHYROXINE SODIUM 100 MCG PO TABS   Oral   Take 100 mcg by mouth every evening.          Marland Kitchen LORAZEPAM 1 MG PO TABS   Oral   Take 1 mg by mouth 2 (two) times daily.         Marland Kitchen  OXYCODONE-ACETAMINOPHEN 5-325 MG PO TABS   Oral   Take 1 tablet by mouth every 4 (four) hours as needed for pain.   20 tablet   0     BP 148/89  Pulse 78  Temp 98.2 F (36.8 C) (Oral)  Resp 20  Ht 5\' 2"  (1.575 m)  Wt 107 lb (48.535 kg)  BMI 19.57 kg/m2  SpO2 97%  Physical Exam  Nursing note and vitals reviewed. Constitutional: She appears well-developed and well-nourished.  HENT:  Head: Normocephalic and atraumatic.  Eyes: Conjunctivae normal are normal.  Neck: Normal range of motion.  Cardiovascular: Normal rate, regular rhythm, normal heart sounds and intact distal pulses.   Pulmonary/Chest: Effort normal and breath sounds normal. She has no wheezes. She has no rhonchi.   She exhibits tenderness.  Abdominal: Soft. Bowel sounds are normal. There is no tenderness.  Musculoskeletal: Normal range of motion.  Neurological: She is alert.    Skin: Skin is warm and dry.  Psychiatric: She has a normal mood and affect.    ED Course  Procedures (including critical care time)  Labs Reviewed - No data to display Dg Ribs Unilateral W/chest Left  01/01/2013  *RADIOLOGY REPORT*  Clinical Data: Cough and left-sided rib pain.  LEFT RIBS AND CHEST - 3+ VIEW  Comparison: 12/29/2012  Findings: The lungs show stable chronic disease/COPD.  No acute infiltrate, edema, pneumothorax or pleural fluid is identified.  Additional left rib films show evidence of a nondisplaced fracture involving the posterolateral left eighth rib which was not visible previously.  IMPRESSION: Nondisplaced fracture of the left eighth rib.   Original Report Authenticated By: Irish Lack, M.D.      1. Fracture of rib of left side       MDM  X-rays reviewed.  Patient with left posterior rib fracture consistent with site of pain, presumptively secondary to coughing.  She was prescribed oxycodone for pain relief.  Also given incentive spur on what her.  She is asked to followup with her PCP for recheck next week.  Encouraged to complete her doxycycline.  Patient is perc negative.        Burgess Amor, [place] 01/01/13 [phone]

## 2013-01-02 NOTE — ED Provider Notes (Signed)
Medical screening examination/treatment/procedure(s) were performed by non-physician practitioner and as supervising physician I was immediately available for consultation/collaboration.   Laray Anger, DO 01/02/13 1954

## 2013-01-10 ENCOUNTER — Encounter (HOSPITAL_COMMUNITY): Payer: Self-pay | Admitting: *Deleted

## 2013-01-10 ENCOUNTER — Emergency Department (HOSPITAL_COMMUNITY)
Admission: EM | Admit: 2013-01-10 | Discharge: 2013-01-11 | Disposition: A | Discharge status: Home / Self Care | Payer: Medicare Other | Attending: Emergency Medicine | Admitting: Emergency Medicine

## 2013-01-10 DIAGNOSIS — Z79899 Other long term (current) drug therapy: Secondary | ICD-10-CM | POA: Insufficient documentation

## 2013-01-10 DIAGNOSIS — I10 Essential (primary) hypertension: Secondary | ICD-10-CM | POA: Insufficient documentation

## 2013-01-10 DIAGNOSIS — F17200 Nicotine dependence, unspecified, uncomplicated: Secondary | ICD-10-CM | POA: Insufficient documentation

## 2013-01-10 DIAGNOSIS — S2239XA Fracture of one rib, unspecified side, initial encounter for closed fracture: Secondary | ICD-10-CM

## 2013-01-10 DIAGNOSIS — IMO0002 Reserved for concepts with insufficient information to code with codable children: Secondary | ICD-10-CM | POA: Insufficient documentation

## 2013-01-10 DIAGNOSIS — Y929 Unspecified place or not applicable: Secondary | ICD-10-CM | POA: Insufficient documentation

## 2013-01-10 DIAGNOSIS — Z8639 Personal history of other endocrine, nutritional and metabolic disease: Secondary | ICD-10-CM | POA: Insufficient documentation

## 2013-01-10 DIAGNOSIS — Y9389 Activity, other specified: Secondary | ICD-10-CM | POA: Insufficient documentation

## 2013-01-10 DIAGNOSIS — E079 Disorder of thyroid, unspecified: Secondary | ICD-10-CM | POA: Insufficient documentation

## 2013-01-10 DIAGNOSIS — R059 Cough, unspecified: Secondary | ICD-10-CM

## 2013-01-10 DIAGNOSIS — Z862 Personal history of diseases of the blood and blood-forming organs and certain disorders involving the immune mechanism: Secondary | ICD-10-CM | POA: Insufficient documentation

## 2013-01-10 DIAGNOSIS — R05 Cough: Secondary | ICD-10-CM | POA: Insufficient documentation

## 2013-01-10 NOTE — ED Notes (Signed)
Pt reports she was diagnosed with a broken rib 1 1/2-2 weeks ago.  Reports she ran out of Percocet today and is having increased pain.  Denies any new SOB.

## 2013-01-11 MED ORDER — NAPROXEN 500 MG PO TABS: 500.0000 mg | ORAL_TABLET | Freq: Two times a day (BID) | ORAL | Status: DC

## 2013-01-11 MED ORDER — ONDANSETRON 4 MG PO TBDP
4.0000 mg | ORAL_TABLET | Freq: Once | ORAL | Status: AC
  Administered 2013-01-11: 4 mg via ORAL
  Filled 2013-01-11: qty 1

## 2013-01-11 MED ORDER — ALBUTEROL SULFATE (5 MG/ML) 0.5% IN NEBU
5.0000 mg | INHALATION_SOLUTION | Freq: Once | RESPIRATORY_TRACT | Status: AC
  Administered 2013-01-11: 5 mg via RESPIRATORY_TRACT
  Filled 2013-01-11: qty 1

## 2013-01-11 MED ORDER — DEXTROMETHORPHAN HBR 15 MG/5ML PO SYRP: 10.0000 mL | ORAL_SOLUTION | Freq: Four times a day (QID) | ORAL | Status: DC | PRN

## 2013-01-11 MED ORDER — ONDANSETRON HCL 4 MG PO TABS: 4.0000 mg | ORAL_TABLET | Freq: Four times a day (QID) | ORAL | Status: DC

## 2013-01-11 MED ORDER — NAPROXEN 250 MG PO TABS
500.0000 mg | ORAL_TABLET | Freq: Once | ORAL | Status: AC
  Administered 2013-01-11: 500 mg via ORAL
  Filled 2013-01-11: qty 2

## 2013-01-11 NOTE — ED Provider Notes (Signed)
History     CSN: [id number]  Arrival date & time 01/10/13  2329   First MD Initiated Contact with Patient 01/10/13 2349      Chief Complaint  Patient presents with  . Rib Injury    (Consider location/radiation/quality/duration/timing/severity/associated sxs/prior treatment) HPI Priscilla Lindsey is a 57 y.o. female with a h/o HTN, persistent cough who presents to the Emergency Department complaining of continued pain to the left chest after being diagnosed with a posterior non displaced 8th rib fracture. Fracture occurred with coughing. She was seen by her PCP and treated for bronchitis with doxycycline. She has used the percocet given her in her ER visit 01/01/13. Continues to have pain to the left chest which is worse with cough. She was seen by her PCP a second time on 01/06/13 and chest xray was taken. She has not heard from PCP on xray results. Denies fever, chills, shortness of breath.   PCP Dr. Loney Hering Past Medical History  Diagnosis Date  . Gout   . Hypertension   . Thyroid disease     Past Surgical History  Procedure Date  . Cholecystectomy   . Abdominal hysterectomy   . Shoulder surgery     History reviewed. No pertinent family history.  History  Substance Use Topics  . Smoking status: Current Every Day Smoker -- 1.0 packs/day    Types: Cigarettes  . Smokeless tobacco: Not on file  . Alcohol Use: No    OB History    Grav Para Term Preterm Abortions TAB SAB Ect Mult Living                  Review of Systems  Constitutional: Negative for fever.       10 Systems reviewed and are negative for acute change except as noted in the HPI.  HENT: Negative for congestion.   Eyes: Negative for discharge and redness.  Respiratory: Positive for cough. Negative for shortness of breath.        Chest wall pain  Cardiovascular: Negative for chest pain.  Gastrointestinal: Negative for vomiting and abdominal pain.  Musculoskeletal: Negative for back pain.  Skin: Negative for  rash.  Neurological: Negative for syncope, numbness and headaches.  Psychiatric/Behavioral:       No behavior change.    Allergies  Tetanus toxoids and Nsaids  Home Medications   Current Outpatient Rx  Name  Route  Sig  Dispense  Refill  . OXYCODONE-ACETAMINOPHEN 5-325 MG PO TABS   Oral   Take 1 tablet by mouth every 4 (four) hours as needed for pain.   20 tablet   0   . ACETAMINOPHEN 500 MG PO TABS   Oral   Take 1,000 mg by mouth every 6 (six) hours as needed. For pain         . ALBUTEROL SULFATE HFA 108 (90 BASE) MCG/ACT IN AERS   Inhalation   Inhale 2 puffs into the lungs every 6 (six) hours as needed. For shortness of breath         . AMLODIPINE BESYLATE 5 MG PO TABS   Oral   Take 5 mg by mouth every evening.          Marland Kitchen HYDROCODONE-ACETAMINOPHEN 5-325 MG PO TABS   Oral   Take 1-2 tablets by mouth every 4 (four) hours as needed. Pain.         Marland Kitchen LEVOTHYROXINE SODIUM 100 MCG PO TABS   Oral   Take 100 mcg by mouth every  evening.          Marland Kitchen LORAZEPAM 1 MG PO TABS   Oral   Take 1 mg by mouth 2 (two) times daily.           BP 140/88  Pulse 102  Temp 97.9 F (36.6 C) (Oral)  Resp 18  Ht 5\' 2"  (1.575 m)  Wt 109 lb (49.442 kg)  BMI 19.94 kg/m2  SpO2 95%  Physical Exam  Nursing note and vitals reviewed. Constitutional:       Awake, alert, nontoxic appearance.  HENT:  Head: Atraumatic.  Eyes: Right eye exhibits no discharge. Left eye exhibits no discharge.  Neck: Neck supple.  Cardiovascular: Normal heart sounds.   Pulmonary/Chest: Effort normal and breath sounds normal. No respiratory distress. She has no wheezes. She exhibits no tenderness.       Coarse cough. Focal tenderness to the posterior chest wall.   Abdominal: Soft. There is no tenderness. There is no rebound.  Musculoskeletal: She exhibits no tenderness.       Baseline ROM, no obvious new focal weakness.  Neurological:       Mental status and motor strength appears baseline for  patient and situation.  Skin: No rash noted.  Psychiatric: She has a normal mood and affect.    ED Course  Procedures (including critical care time)    12:43 AM:  T/C to Dr. Lowella Dandy, radiologist. He was able to check the xray done at [facility] on 01/06/13. Chronic changes without acute findings.  0100 Reviewed the results with patient. Given naproxen and zofran. Ordered albuterol treatment.  MDM  Patient with continued pain to the left chest s/op rib fracture due to a cough. Last chest xray done 01/06/13 without acute changes. Given naproxen and zofran. Nebulizer treatment given. Patient is to follow up with PCP. Pt stable in ED with no significant deterioration in condition.The patient appears reasonably screened and/or stabilized for discharge and I doubt any other medical condition or other [facility] requiring further screening, evaluation, or treatment in the ED at this time prior to discharge.  MDM Reviewed: nursing note, vitals and previous chart Reviewed previous: x-ray Consults: radiologist.           Nicoletta Dress. Colon Branch, MD 01/11/13 1610

## 2013-01-11 NOTE — ED Notes (Signed)
Pt states she was seen and diagnosed with a broken rib a week ago due to a cough. States the pain is continued. States worse with cough and notes her cough is still present but is improved.

## 2013-01-11 NOTE — ED Notes (Signed)
resp therapy in room with breathing treatment at this time

## 2013-02-12 ENCOUNTER — Emergency Department (HOSPITAL_COMMUNITY)
Admission: EM | Admit: 2013-02-12 | Discharge: 2013-02-12 | Disposition: A | Discharge status: Home / Self Care | Payer: Medicare Other | Attending: Emergency Medicine | Admitting: Emergency Medicine

## 2013-02-12 ENCOUNTER — Encounter (HOSPITAL_COMMUNITY): Payer: Self-pay | Admitting: *Deleted

## 2013-02-12 DIAGNOSIS — M109 Gout, unspecified: Secondary | ICD-10-CM | POA: Insufficient documentation

## 2013-02-12 DIAGNOSIS — M25579 Pain in unspecified ankle and joints of unspecified foot: Secondary | ICD-10-CM | POA: Insufficient documentation

## 2013-02-12 DIAGNOSIS — E079 Disorder of thyroid, unspecified: Secondary | ICD-10-CM | POA: Insufficient documentation

## 2013-02-12 DIAGNOSIS — I10 Essential (primary) hypertension: Secondary | ICD-10-CM | POA: Insufficient documentation

## 2013-02-12 DIAGNOSIS — F17200 Nicotine dependence, unspecified, uncomplicated: Secondary | ICD-10-CM | POA: Insufficient documentation

## 2013-02-12 DIAGNOSIS — M2550 Pain in unspecified joint: Secondary | ICD-10-CM | POA: Insufficient documentation

## 2013-02-12 DIAGNOSIS — Z79899 Other long term (current) drug therapy: Secondary | ICD-10-CM | POA: Insufficient documentation

## 2013-02-12 DIAGNOSIS — J4489 Other specified chronic obstructive pulmonary disease: Secondary | ICD-10-CM | POA: Insufficient documentation

## 2013-02-12 HISTORY — DX: Chronic obstructive pulmonary disease, unspecified: J44.9

## 2013-02-12 MED ORDER — PREDNISONE 20 MG PO TABS: ORAL_TABLET | ORAL | Status: DC

## 2013-02-12 MED ORDER — HYDROCODONE-ACETAMINOPHEN 5-325 MG PO TABS: ORAL_TABLET | ORAL | Status: DC

## 2013-02-12 NOTE — ED Provider Notes (Signed)
History     CSN: [id number]  Arrival date & time 02/12/13  1401   First MD Initiated Contact with Patient 02/12/13 1421      Chief Complaint  Patient presents with  . Foot Pain    (Consider location/radiation/quality/duration/timing/severity/associated sxs/prior treatment) HPI Comments: Patient c/o sudden onset of pain to her right ankle.  States she has a hx of gout and pain feels similar to previous.  States that she usually has gout in her great toe but states she has also had a gout flare in her ankle as well.  She denies swelling , redness or recent injury.    Patient is a 57 y.o. female presenting with lower extremity pain. The history is provided by the patient.  Foot Pain This is a recurrent problem. The current episode started today. The problem occurs constantly. The problem has been gradually worsening. Associated symptoms include arthralgias. Pertinent negatives include no chills, fever, headaches, joint swelling, myalgias, nausea, numbness, rash, vomiting or weakness. The symptoms are aggravated by walking and standing. She has tried nothing for the symptoms. The treatment provided no relief.    Past Medical History  Diagnosis Date  . Gout   . Hypertension   . Thyroid disease   . COPD (chronic obstructive pulmonary disease)     Past Surgical History  Procedure Laterality Date  . Cholecystectomy    . Abdominal hysterectomy    . Shoulder surgery      History reviewed. No pertinent family history.  History  Substance Use Topics  . Smoking status: Current Every Day Smoker -- 1.00 packs/day    Types: Cigarettes  . Smokeless tobacco: Not on file  . Alcohol Use: No    OB History   Grav Para Term Preterm Abortions TAB SAB Ect Mult Living                  Review of Systems  Constitutional: Negative for fever and chills.  Gastrointestinal: Negative for nausea and vomiting.  Genitourinary: Negative for dysuria and difficulty urinating.  Musculoskeletal:  Positive for arthralgias. Negative for myalgias and joint swelling.  Skin: Negative for color change, rash and wound.  Neurological: Negative for weakness, numbness and headaches.  All other systems reviewed and are negative.    Allergies  Tetanus toxoids and Nsaids  Home Medications   Current Outpatient Rx  Name  Route  Sig  Dispense  Refill  . acetaminophen (TYLENOL) 500 MG tablet   Oral   Take 1,000 mg by mouth every 6 (six) hours as needed. For pain         . albuterol (VENTOLIN HFA) 108 (90 BASE) MCG/ACT inhaler   Inhalation   Inhale 2 puffs into the lungs every 6 (six) hours as needed. For shortness of breath         . amLODipine (NORVASC) 5 MG tablet   Oral   Take 5 mg by mouth every evening.          Marland Kitchen dextromethorphan 15 MG/5ML syrup   Oral   Take 10 mLs (30 mg total) by mouth 4 (four) times daily as needed for cough.   120 mL   0   . HYDROcodone-acetaminophen (NORCO/VICODIN) 5-325 MG per tablet   Oral   Take 1-2 tablets by mouth every 4 (four) hours as needed. Pain.         Marland Kitchen levothyroxine (SYNTHROID, LEVOTHROID) 100 MCG tablet   Oral   Take 100 mcg by mouth every evening.          Marland Kitchen  LORazepam (ATIVAN) 1 MG tablet   Oral   Take 1 mg by mouth 2 (two) times daily.         . naproxen (NAPROSYN) 500 MG tablet   Oral   Take 1 tablet (500 mg total) by mouth 2 (two) times daily.   30 tablet   0   . ondansetron (ZOFRAN) 4 MG tablet   Oral   Take 1 tablet (4 mg total) by mouth every 6 (six) hours.   12 tablet   0   . oxyCODONE-acetaminophen (PERCOCET/ROXICET) 5-325 MG per tablet   Oral   Take 1 tablet by mouth every 4 (four) hours as needed for pain.   20 tablet   0     BP 122/81  Pulse 96  Temp(Src) 98.2 F (36.8 C) (Oral)  Ht 5\' 2"  (1.575 m)  Wt 106 lb (48.081 kg)  BMI 19.38 kg/m2  SpO2 96%  Physical Exam  Nursing note and vitals reviewed. Constitutional: She is oriented to person, place, and time. She appears well-developed  and well-nourished. No distress.  HENT:  Head: Normocephalic and atraumatic.  Cardiovascular: Normal rate, regular rhythm, normal heart sounds and intact distal pulses.   Pulmonary/Chest: Effort normal and breath sounds normal.  Musculoskeletal: She exhibits tenderness. She exhibits no edema.  ttp of the lateral right ankle.  ROM is preserved, pain is reproduced with movement.  DP pulse is brisk, distal sensation intact.  No erythema, abrasion, bruising or bony deformity.  No proximal tendrness  Neurological: She is alert and oriented to person, place, and time. She exhibits normal muscle tone. Coordination normal.  Skin: Skin is warm and dry.    ED Course  Procedures (including critical care time)  Labs Reviewed - No data to display No results found.      MDM   Previous ED chart reviewed.     Localized ttp of the lateral right ankle, mild STS present.  No wounds, erythema or excessive warmth.  Pt denies injury.  Reports hx of gout and states pain is similar to previous gout flares.  No proximal tenderness of exam.  Doubt cellulitis.  Will treat with prednisone taper and norco #20.  Pt agrees to f/u with her PMD for recheck.       Empress Newmann L. Vega, [place] 02/14/13 1647

## 2013-02-12 NOTE — ED Notes (Signed)
Pt seen and eval by EDPa for initial assessment. 

## 2013-02-12 NOTE — ED Notes (Signed)
Pain rt foot since 5am, Thinks it is gout flare.

## 2013-02-15 NOTE — ED Provider Notes (Signed)
Medical screening examination/treatment/procedure(s) were performed by non-physician practitioner and as supervising physician I was immediately available for consultation/collaboration.   Shelda Jakes, MD 02/15/13 1130

## 2013-03-11 ENCOUNTER — Emergency Department (HOSPITAL_COMMUNITY)
Admission: EM | Admit: 2013-03-11 | Discharge: 2013-03-11 | Disposition: A | Discharge status: Home / Self Care | Payer: Medicare Other | Attending: Emergency Medicine | Admitting: Emergency Medicine

## 2013-03-11 ENCOUNTER — Encounter (HOSPITAL_COMMUNITY): Payer: Self-pay | Admitting: *Deleted

## 2013-03-11 DIAGNOSIS — Z862 Personal history of diseases of the blood and blood-forming organs and certain disorders involving the immune mechanism: Secondary | ICD-10-CM | POA: Insufficient documentation

## 2013-03-11 DIAGNOSIS — I10 Essential (primary) hypertension: Secondary | ICD-10-CM | POA: Insufficient documentation

## 2013-03-11 DIAGNOSIS — Z8639 Personal history of other endocrine, nutritional and metabolic disease: Secondary | ICD-10-CM | POA: Insufficient documentation

## 2013-03-11 DIAGNOSIS — R0602 Shortness of breath: Secondary | ICD-10-CM | POA: Insufficient documentation

## 2013-03-11 DIAGNOSIS — Z79899 Other long term (current) drug therapy: Secondary | ICD-10-CM | POA: Insufficient documentation

## 2013-03-11 DIAGNOSIS — M25579 Pain in unspecified ankle and joints of unspecified foot: Secondary | ICD-10-CM | POA: Insufficient documentation

## 2013-03-11 DIAGNOSIS — F17200 Nicotine dependence, unspecified, uncomplicated: Secondary | ICD-10-CM | POA: Insufficient documentation

## 2013-03-11 DIAGNOSIS — M25473 Effusion, unspecified ankle: Secondary | ICD-10-CM | POA: Insufficient documentation

## 2013-03-11 DIAGNOSIS — J4489 Other specified chronic obstructive pulmonary disease: Secondary | ICD-10-CM | POA: Insufficient documentation

## 2013-03-11 DIAGNOSIS — E079 Disorder of thyroid, unspecified: Secondary | ICD-10-CM | POA: Insufficient documentation

## 2013-03-11 DIAGNOSIS — M25476 Effusion, unspecified foot: Secondary | ICD-10-CM | POA: Insufficient documentation

## 2013-03-11 MED ORDER — HYDROCODONE-ACETAMINOPHEN 5-325 MG PO TABS: 1.0000 | ORAL_TABLET | ORAL | Status: DC | PRN

## 2013-03-11 MED ORDER — PREDNISONE 10 MG PO TABS: ORAL_TABLET | ORAL | Status: DC

## 2013-03-11 NOTE — ED Notes (Signed)
Pain rt foot , says she thinks it is gout flare .

## 2013-03-11 NOTE — ED Provider Notes (Signed)
History     CSN: [id number]  Arrival date & time 03/11/13  2043   First MD Initiated Contact with Patient 03/11/13 2157      Chief Complaint  Patient presents with  . Foot Pain    (Consider location/radiation/quality/duration/timing/severity/associated sxs/prior treatment) Patient is a 57 y.o. female presenting with lower extremity pain. The history is provided by the patient.  Foot Pain This is a recurrent problem. The current episode started in the past 7 days. The problem occurs constantly. The problem has been gradually worsening. Associated symptoms include arthralgias and joint swelling. Pertinent negatives include no abdominal pain, chest pain, chills, coughing, fever or neck pain. The symptoms are aggravated by standing and walking. She has tried nothing for the symptoms. The treatment provided no relief.    Past Medical History  Diagnosis Date  . Gout   . Hypertension   . Thyroid disease   . COPD (chronic obstructive pulmonary disease)     Past Surgical History  Procedure Laterality Date  . Cholecystectomy    . Abdominal hysterectomy    . Shoulder surgery      History reviewed. No pertinent family history.  History  Substance Use Topics  . Smoking status: Current Every Day Smoker -- 1.00 packs/day    Types: Cigarettes  . Smokeless tobacco: Not on file  . Alcohol Use: No    OB History   Grav Para Term Preterm Abortions TAB SAB Ect Mult Living                  Review of Systems  Constitutional: Negative for fever, chills and activity change.       All ROS Neg except as noted in HPI  HENT: Negative for nosebleeds and neck pain.   Eyes: Negative for photophobia and discharge.  Respiratory: Positive for shortness of breath. Negative for cough and wheezing.   Cardiovascular: Negative for chest pain and palpitations.  Gastrointestinal: Negative for abdominal pain and blood in stool.  Genitourinary: Negative for dysuria, frequency and hematuria.   Musculoskeletal: Positive for joint swelling and arthralgias. Negative for back pain.  Skin: Negative.   Neurological: Negative for dizziness, seizures and speech difficulty.  Psychiatric/Behavioral: Negative for hallucinations and confusion.    Allergies  Tetanus toxoids and Nsaids  Home Medications   Current Outpatient Rx  Name  Route  Sig  Dispense  Refill  . albuterol (VENTOLIN HFA) 108 (90 BASE) MCG/ACT inhaler   Inhalation   Inhale 2 puffs into the lungs every 6 (six) hours as needed. For shortness of breath         . amLODipine (NORVASC) 5 MG tablet   Oral   Take 5 mg by mouth every evening.          Marland Kitchen levothyroxine (SYNTHROID, LEVOTHROID) 100 MCG tablet   Oral   Take 100 mcg by mouth every evening.          Marland Kitchen acetaminophen (TYLENOL) 500 MG tablet   Oral   Take 1,000 mg by mouth every 6 (six) hours as needed. For pain           BP 140/72  Pulse 79  Temp(Src) 98.1 F (36.7 C) (Oral)  Resp 16  Ht 5\' 2"  (1.575 m)  Wt 106 lb (48.081 kg)  BMI 19.38 kg/m2  SpO2 100%  Physical Exam  Nursing note and vitals reviewed. Constitutional: She is oriented to person, place, and time. She appears well-developed and well-nourished.  Non-toxic appearance.  HENT:  Head:  Normocephalic.  Right Ear: Tympanic membrane and external ear normal.  Left Ear: Tympanic membrane and external ear normal.  Eyes: EOM and lids are normal. Pupils are equal, round, and reactive to light.  Neck: Normal range of motion. Neck supple. Carotid bruit is not present.  Cardiovascular: Normal rate, regular rhythm, normal heart sounds, intact distal pulses and normal pulses.   Pulmonary/Chest: Breath sounds normal. No respiratory distress.  Abdominal: Soft. Bowel sounds are normal. There is no tenderness. There is no guarding.  Musculoskeletal: Normal range of motion.  There is soreness with range of motion of the shoulders.  There is good range of motion of the right knee, with some  crepitus present. There is no deformity or increased warmth involving the anterior tibial area. The Achilles tendon is intact. The dorsalis pedis pulses 2+. There is pain to palpation of the right first toe. There is severe pain with any attempted movement of the right first toe. There is no plantar surface lesion or evidence of puncture.  Lymphadenopathy:       Head (right side): No submandibular adenopathy present.       Head (left side): No submandibular adenopathy present.    She has no cervical adenopathy.  Neurological: She is alert and oriented to person, place, and time. She has normal strength. No cranial nerve deficit or sensory deficit.  Skin: Skin is warm and dry.  Psychiatric: She has a normal mood and affect. Her speech is normal.    ED Course  Procedures (including critical care time)  Labs Reviewed - No data to display No results found.   No diagnosis found.    MDM  I have reviewed nursing notes, vital signs, and all appropriate lab and imaging results for this patient. Patient has a history of gout. She has increasing frequency of foot pain. Was seen in the emergency department in the month of February for similar episode. The patient states that she is in between physicians, had an acute flareup tonight and could not stand the pain any longer.  The examination is consistent with an inflammatory attack. Question degenerative joint disease versus gout related issue. The plan at this time is for the patient be treated with a prednisone taper as well as Norco every 4 hours as needed for pain. The patient is driving and she requests a  To go pack as she cannot get the prescription filled tonight.       Kathie Dike, PA-C 03/11/13 2236

## 2013-03-11 NOTE — ED Provider Notes (Signed)
Medical screening examination/treatment/procedure(s) were performed by non-physician practitioner and as supervising physician I was immediately available for consultation/collaboration.  Gilda Crease, MD 03/11/13 [phone]

## 2013-03-11 NOTE — ED Notes (Signed)
Pt given pre-pack of Norco before discharge.

## 2013-03-17 MED FILL — Hydrocodone-Acetaminophen Tab 5-325 MG: ORAL | Qty: 6 | Status: AC

## 2013-04-07 ENCOUNTER — Encounter (HOSPITAL_COMMUNITY): Payer: Self-pay

## 2013-04-07 ENCOUNTER — Emergency Department (HOSPITAL_COMMUNITY)
Admission: EM | Admit: 2013-04-07 | Discharge: 2013-04-07 | Disposition: A | Discharge status: Home / Self Care | Payer: Medicare Other | Attending: Emergency Medicine | Admitting: Emergency Medicine

## 2013-04-07 DIAGNOSIS — F17200 Nicotine dependence, unspecified, uncomplicated: Secondary | ICD-10-CM | POA: Insufficient documentation

## 2013-04-07 DIAGNOSIS — I10 Essential (primary) hypertension: Secondary | ICD-10-CM | POA: Insufficient documentation

## 2013-04-07 DIAGNOSIS — E079 Disorder of thyroid, unspecified: Secondary | ICD-10-CM | POA: Insufficient documentation

## 2013-04-07 DIAGNOSIS — M109 Gout, unspecified: Secondary | ICD-10-CM | POA: Insufficient documentation

## 2013-04-07 DIAGNOSIS — J449 Chronic obstructive pulmonary disease, unspecified: Secondary | ICD-10-CM | POA: Insufficient documentation

## 2013-04-07 DIAGNOSIS — Z79899 Other long term (current) drug therapy: Secondary | ICD-10-CM | POA: Insufficient documentation

## 2013-04-07 DIAGNOSIS — J4489 Other specified chronic obstructive pulmonary disease: Secondary | ICD-10-CM | POA: Insufficient documentation

## 2013-04-07 MED ORDER — OXYCODONE-ACETAMINOPHEN 5-325 MG PO TABS: 1.0000 | ORAL_TABLET | ORAL | Status: DC | PRN

## 2013-04-07 MED ORDER — PREDNISONE 10 MG PO TABS: ORAL_TABLET | ORAL | Status: DC

## 2013-04-07 NOTE — ED Notes (Signed)
Pt reports has history of gout in r great toe.  C/O pain since last night.

## 2013-04-07 NOTE — ED Provider Notes (Signed)
History     CSN: [id number]  Arrival date & time 04/07/13  1410   First MD Initiated Contact with Patient 04/07/13 1423      Chief Complaint  Patient presents with  . Toe Pain    (Consider location/radiation/quality/duration/timing/severity/associated sxs/prior treatment) HPI Comments: Priscilla Lindsey is a 57 y.o. Female presenting with her gout pain awaking her from sleep last night in her right great toe.  She reports constant pain at the base of this toe which is worsened with just the lightest touch such as from her bedsheet along with the beginning of swelling and redness consistent with prior gouty attacks.  She denies injury and has had no fevers, chills and has had no rash, punctures or other skin injuries at the site.  She has not contacted her current pcp (in [place]) about this problem as she is currently waiting to establish care with a new provider in [place].  She is not on daily gout medicine.  She has taken tylenol without relief of her symptoms.  Her pain is constant and throbbing without radiation.     The history is provided by the patient.    Past Medical History  Diagnosis Date  . Gout   . Hypertension   . Thyroid disease   . COPD (chronic obstructive pulmonary disease)     Past Surgical History  Procedure Laterality Date  . Cholecystectomy    . Abdominal hysterectomy    . Shoulder surgery      No family history on file.  History  Substance Use Topics  . Smoking status: Current Every Day Smoker -- 1.00 packs/day    Types: Cigarettes  . Smokeless tobacco: Not on file  . Alcohol Use: No    OB History   Grav Para Term Preterm Abortions TAB SAB Ect Mult Living                  Review of Systems  Constitutional: Negative for fever.  Musculoskeletal: Positive for joint swelling and arthralgias. Negative for myalgias.  Neurological: Negative for weakness and numbness.    Allergies  Tetanus toxoids; Nsaids; Toradol; and Tramadol  Home  Medications   Current Outpatient Rx  Name  Route  Sig  Dispense  Refill  . albuterol (PROVENTIL) (2.5 MG/3ML) 0.083% nebulizer solution   Nebulization   Take 2.5 mg by nebulization every 6 (six) hours as needed for wheezing.         Marland Kitchen albuterol (VENTOLIN HFA) 108 (90 BASE) MCG/ACT inhaler   Inhalation   Inhale 2 puffs into the lungs every 6 (six) hours as needed. For shortness of breath         . amLODipine (NORVASC) 2.5 MG tablet   Oral   Take 2.5 mg by mouth 2 (two) times daily.         Marland Kitchen HYDROcodone-acetaminophen (NORCO/VICODIN) 5-325 MG per tablet   Oral   Take 1 tablet by mouth every 4 (four) hours as needed for pain.   6 tablet   0   . levothyroxine (SYNTHROID, LEVOTHROID) 100 MCG tablet   Oral   Take 100 mcg by mouth every evening.          Marland Kitchen acetaminophen (TYLENOL) 500 MG tablet   Oral   Take 1,000 mg by mouth every 6 (six) hours as needed. For pain         . oxyCODONE-acetaminophen (PERCOCET/ROXICET) 5-325 MG per tablet   Oral   Take 1 tablet by mouth every  4 (four) hours as needed for pain.   12 tablet   0   . predniSONE (DELTASONE) 10 MG tablet      6,5,4,3,2,1 - take with food   21 tablet   0   . predniSONE (DELTASONE) 10 MG tablet      6, 5, 4, 3, 2 then 1 tablet by mouth daily for 6 days total.   21 tablet   0     BP 128/96  Pulse 98  Temp(Src) 97.3 F (36.3 C) (Oral)  Resp 18  Ht 5\' 2"  (1.575 m)  Wt 108 lb 2 oz (49.045 kg)  BMI 19.77 kg/m2  SpO2 98%  Physical Exam  Constitutional: She appears well-developed and well-nourished.  HENT:  Head: Atraumatic.  Neck: Normal range of motion.  Cardiovascular:  Pulses equal bilaterally  Musculoskeletal: She exhibits tenderness.       Right foot: She exhibits tenderness and swelling. She exhibits no deformity.  Exquisite ttp right mtp joint and distal toe.  Slight erythema and edema. Skin intact.  Less than 3 sec cap refill.    Neurological: She is alert. She has normal strength. She  displays normal reflexes. No sensory deficit.  Equal strength  Skin: Skin is warm and dry.  Psychiatric: She has a normal mood and affect.    ED Course  Procedures (including critical care time)  Labs Reviewed - No data to display No results found.   1. Gout attack       MDM  Pt with known h/o gout with classic sx of flair.  She has had frequent visits to this ed for this same problem.  Discussed need to obtain pcp for chronic care of this condition.  Pt understands.  She was prescribed a prednisone taper,  Oxycodone.  Encouraged heat,  Elevation of extremity.  No h/o trauma per pt report.  Exam c/w gout flare.        Burgess Amor, PA-C 04/08/13 [phone]

## 2013-04-08 NOTE — ED Provider Notes (Signed)
Medical screening examination/treatment/procedure(s) were performed by non-physician practitioner and as supervising physician I was immediately available for consultation/collaboration.   Coutney Wildermuth, MD 04/08/13 0648 

## 2013-05-07 ENCOUNTER — Emergency Department (HOSPITAL_COMMUNITY)
Admission: EM | Admit: 2013-05-07 | Discharge: 2013-05-07 | Disposition: A | Discharge status: Home / Self Care | Payer: Medicare Other | Attending: Emergency Medicine | Admitting: Emergency Medicine

## 2013-05-07 ENCOUNTER — Encounter (HOSPITAL_COMMUNITY): Payer: Self-pay | Admitting: *Deleted

## 2013-05-07 DIAGNOSIS — I10 Essential (primary) hypertension: Secondary | ICD-10-CM | POA: Insufficient documentation

## 2013-05-07 DIAGNOSIS — R05 Cough: Secondary | ICD-10-CM | POA: Insufficient documentation

## 2013-05-07 DIAGNOSIS — M109 Gout, unspecified: Secondary | ICD-10-CM | POA: Insufficient documentation

## 2013-05-07 DIAGNOSIS — F17200 Nicotine dependence, unspecified, uncomplicated: Secondary | ICD-10-CM | POA: Insufficient documentation

## 2013-05-07 DIAGNOSIS — J4489 Other specified chronic obstructive pulmonary disease: Secondary | ICD-10-CM | POA: Insufficient documentation

## 2013-05-07 DIAGNOSIS — M545 Low back pain, unspecified: Secondary | ICD-10-CM | POA: Insufficient documentation

## 2013-05-07 DIAGNOSIS — R059 Cough, unspecified: Secondary | ICD-10-CM | POA: Insufficient documentation

## 2013-05-07 DIAGNOSIS — Z79899 Other long term (current) drug therapy: Secondary | ICD-10-CM | POA: Insufficient documentation

## 2013-05-07 DIAGNOSIS — E079 Disorder of thyroid, unspecified: Secondary | ICD-10-CM | POA: Insufficient documentation

## 2013-05-07 DIAGNOSIS — J449 Chronic obstructive pulmonary disease, unspecified: Secondary | ICD-10-CM | POA: Insufficient documentation

## 2013-05-07 MED ORDER — HYDROCODONE-ACETAMINOPHEN 5-325 MG PO TABS: 1.0000 | ORAL_TABLET | ORAL | Status: DC | PRN

## 2013-05-07 MED ORDER — METHOCARBAMOL 500 MG PO TABS: 500.0000 mg | ORAL_TABLET | Freq: Three times a day (TID) | ORAL | Status: DC

## 2013-05-07 NOTE — ED Notes (Signed)
Low back pain,onset after "a coughing spell"

## 2013-05-07 NOTE — ED Provider Notes (Signed)
History     CSN: [id number]  Arrival date & time 05/07/13  1129   First MD Initiated Contact with Patient 05/07/13 1304      Chief Complaint  Patient presents with  . Back Pain    (Consider location/radiation/quality/duration/timing/severity/associated sxs/prior treatment) Patient is a 57 y.o. female presenting with back pain. The history is provided by the patient.  Back Pain Location:  Lumbar spine Quality:  Aching Pain severity:  Moderate Worse during: Hurts with movement. Onset quality:  Gradual Timing:  Intermittent Progression:  Worsening Chronicity:  Recurrent Context comment:  Hx of back problems, worse with cough Relieved by:  Nothing Worsened by:  Coughing and movement Ineffective treatments:  None tried Associated symptoms: no abdominal pain, no bladder incontinence, no bowel incontinence, no chest pain, no dysuria and no perianal numbness     Past Medical History  Diagnosis Date  . Gout   . Hypertension   . Thyroid disease   . COPD (chronic obstructive pulmonary disease)     Past Surgical History  Procedure Laterality Date  . Cholecystectomy    . Abdominal hysterectomy    . Shoulder surgery      History reviewed. No pertinent family history.  History  Substance Use Topics  . Smoking status: Current Every Day Smoker -- 1.00 packs/day    Types: Cigarettes  . Smokeless tobacco: Not on file  . Alcohol Use: No    OB History   Grav Para Term Preterm Abortions TAB SAB Ect Mult Living                  Review of Systems  Constitutional: Negative for activity change.       All ROS Neg except as noted in HPI  HENT: Negative for nosebleeds and neck pain.   Eyes: Negative for photophobia and discharge.  Respiratory: Positive for cough. Negative for shortness of breath and wheezing.   Cardiovascular: Negative for chest pain and palpitations.  Gastrointestinal: Negative for abdominal pain, blood in stool and bowel incontinence.  Genitourinary:  Negative for bladder incontinence, dysuria, frequency and hematuria.  Musculoskeletal: Positive for back pain and arthralgias.  Skin: Negative.   Neurological: Negative for dizziness, seizures and speech difficulty.  Psychiatric/Behavioral: Negative for hallucinations and confusion.    Allergies  Tetanus toxoids; Nsaids; Toradol; and Tramadol  Home Medications   Current Outpatient Rx  Name  Route  Sig  Dispense  Refill  . albuterol (PROVENTIL) (2.5 MG/3ML) 0.083% nebulizer solution   Nebulization   Take 2.5 mg by nebulization every 6 (six) hours as needed for wheezing.         Marland Kitchen albuterol (VENTOLIN HFA) 108 (90 BASE) MCG/ACT inhaler   Inhalation   Inhale 2 puffs into the lungs every 6 (six) hours as needed. For shortness of breath         . amLODipine (NORVASC) 2.5 MG tablet   Oral   Take 2.5 mg by mouth daily.          Marland Kitchen levothyroxine (SYNTHROID, LEVOTHROID) 100 MCG tablet   Oral   Take 100 mcg by mouth every evening.          Marland Kitchen oxyCODONE-acetaminophen (PERCOCET/ROXICET) 5-325 MG per tablet   Oral   Take 1 tablet by mouth every 4 (four) hours as needed for pain.   12 tablet   0     BP 124/84  Pulse 79  Temp(Src) 98.1 F (36.7 C) (Oral)  Resp 18  Ht 5\' 2"  (1.575 m)  Wt 108 lb (48.988 kg)  BMI 19.75 kg/m2  SpO2 98%  Physical Exam  Nursing note and vitals reviewed. Constitutional: She is oriented to person, place, and time. She appears well-developed and well-nourished.  Non-toxic appearance.  HENT:  Head: Normocephalic.  Right Ear: Tympanic membrane and external ear normal.  Left Ear: Tympanic membrane and external ear normal.  Eyes: EOM and lids are normal. Pupils are equal, round, and reactive to light.  Neck: Normal range of motion. Neck supple. Carotid bruit is not present.  Cardiovascular: Normal rate, regular rhythm, normal heart sounds, intact distal pulses and normal pulses.   Pulmonary/Chest: Breath sounds normal. No respiratory distress.    Abdominal: Soft. Bowel sounds are normal. There is no tenderness. There is no guarding.  Musculoskeletal: Normal range of motion.       Arms: Lymphadenopathy:       Head (right side): No submandibular adenopathy present.       Head (left side): No submandibular adenopathy present.    She has no cervical adenopathy.  Neurological: She is alert and oriented to person, place, and time. She has normal strength. No cranial nerve deficit or sensory deficit.  Skin: Skin is warm and dry.  Psychiatric: She has a normal mood and affect. Her speech is normal.    ED Course  Procedures (including critical care time)  Labs Reviewed - No data to display No results found.   No diagnosis found.    MDM  I have reviewed nursing notes, vital signs, and all appropriate lab and imaging results for this patient. Pt has hx of arthritis in the lower back. She states she had a coughing episode followed by back pain. The pain will not respond to home measures. No gross neuro deficit noted.  Plan: Pt to alternate heat and ice. Rx for norco and robaxin given to the patient.       Kathie Dike, PA-C 05/07/13 1347

## 2013-05-10 NOTE — ED Provider Notes (Signed)
Medical screening examination/treatment/procedure(s) were performed by non-physician practitioner and as supervising physician I was immediately available for consultation/collaboration.   Amere Bricco, MD 05/10/13 2206 

## 2013-05-14 ENCOUNTER — Emergency Department (HOSPITAL_COMMUNITY)
Admission: EM | Admit: 2013-05-14 | Discharge: 2013-05-14 | Disposition: A | Discharge status: Home / Self Care | Payer: Medicare Other | Attending: Emergency Medicine | Admitting: Emergency Medicine

## 2013-05-14 ENCOUNTER — Encounter (HOSPITAL_COMMUNITY): Payer: Self-pay | Admitting: *Deleted

## 2013-05-14 DIAGNOSIS — I10 Essential (primary) hypertension: Secondary | ICD-10-CM | POA: Insufficient documentation

## 2013-05-14 DIAGNOSIS — Z79899 Other long term (current) drug therapy: Secondary | ICD-10-CM | POA: Insufficient documentation

## 2013-05-14 DIAGNOSIS — F17200 Nicotine dependence, unspecified, uncomplicated: Secondary | ICD-10-CM | POA: Insufficient documentation

## 2013-05-14 DIAGNOSIS — J4489 Other specified chronic obstructive pulmonary disease: Secondary | ICD-10-CM | POA: Insufficient documentation

## 2013-05-14 DIAGNOSIS — M545 Low back pain, unspecified: Secondary | ICD-10-CM | POA: Insufficient documentation

## 2013-05-14 DIAGNOSIS — E079 Disorder of thyroid, unspecified: Secondary | ICD-10-CM | POA: Insufficient documentation

## 2013-05-14 DIAGNOSIS — J449 Chronic obstructive pulmonary disease, unspecified: Secondary | ICD-10-CM | POA: Insufficient documentation

## 2013-05-14 DIAGNOSIS — Z8739 Personal history of other diseases of the musculoskeletal system and connective tissue: Secondary | ICD-10-CM | POA: Insufficient documentation

## 2013-05-14 MED ORDER — OXYCODONE-ACETAMINOPHEN 5-325 MG PO TABS
1.0000 | ORAL_TABLET | Freq: Once | ORAL | Status: AC
  Administered 2013-05-14: 1 via ORAL
  Filled 2013-05-14: qty 1

## 2013-05-14 MED ORDER — METHOCARBAMOL 500 MG PO TABS: 500.0000 mg | ORAL_TABLET | Freq: Two times a day (BID) | ORAL | Status: DC

## 2013-05-14 MED ORDER — HYDROCODONE-ACETAMINOPHEN 5-325 MG PO TABS: 1.0000 | ORAL_TABLET | ORAL | Status: DC | PRN

## 2013-05-14 MED ORDER — CYCLOBENZAPRINE HCL 10 MG PO TABS: 10.0000 mg | ORAL_TABLET | Freq: Two times a day (BID) | ORAL | Status: DC | PRN

## 2013-05-14 NOTE — ED Notes (Signed)
Low back pain for over 1 week, Has been seen here for same. No injury known

## 2013-05-14 NOTE — ED Notes (Signed)
Ongoing back pain since last visit. States she is no better.

## 2013-05-14 NOTE — ED Provider Notes (Signed)
History     CSN: [id number]  Arrival date & time 05/14/13  1326   First MD Initiated Contact with Patient 05/14/13 1500      Chief Complaint  Patient presents with  . Back Pain    (Consider location/radiation/quality/duration/timing/severity/associated sxs/prior treatment) HPI Priscilla Lindsey is a 57 y.o. female who presents to the ED with back pain. The pain has continued since her last visit. The Robaxin helped some but she ran out. Denies urinary symptoms. The history was provided by the patient.  Past Medical History  Diagnosis Date  . Gout   . Hypertension   . Thyroid disease   . COPD (chronic obstructive pulmonary disease)     Past Surgical History  Procedure Laterality Date  . Cholecystectomy    . Abdominal hysterectomy    . Shoulder surgery      No family history on file.  History  Substance Use Topics  . Smoking status: Current Every Day Smoker -- 1.00 packs/day    Types: Cigarettes  . Smokeless tobacco: Not on file  . Alcohol Use: No    OB History   Grav Para Term Preterm Abortions TAB SAB Ect Mult Living                  Review of Systems  Constitutional: Negative for activity change.  HENT: Negative for neck pain.   Cardiovascular: Negative for chest pain.  Gastrointestinal: Negative for nausea, vomiting and abdominal pain.  Musculoskeletal: Positive for back pain.  Skin: Negative for wound.  Neurological: Negative for headaches.  Psychiatric/Behavioral: The patient is not nervous/anxious.     Allergies  Tetanus toxoids; Morphine and related; Nsaids; Toradol; and Tramadol  Home Medications   Current Outpatient Rx  Name  Route  Sig  Dispense  Refill  . albuterol (VENTOLIN HFA) 108 (90 BASE) MCG/ACT inhaler   Inhalation   Inhale 2 puffs into the lungs every 6 (six) hours as needed for shortness of breath.          Marland Kitchen amLODipine (NORVASC) 2.5 MG tablet   Oral   Take 2.5 mg by mouth daily.         Marland Kitchen levothyroxine (SYNTHROID,  LEVOTHROID) 100 MCG tablet   Oral   Take 100 mcg by mouth every evening.            BP 156/88  Pulse 84  Temp(Src) 97.6 F (36.4 C) (Oral)  Resp 20  Ht 5\' 2"  (1.575 m)  Wt 110 lb (49.896 kg)  BMI 20.11 kg/m2  SpO2 96%  Physical Exam  Nursing note and vitals reviewed. Constitutional: She is oriented to person, place, and time. She appears well-developed and well-nourished.  HENT:  Head: Normocephalic.  Eyes: EOM are normal.  Neck: Normal range of motion. Neck supple.  Cardiovascular: Normal rate.   Pulmonary/Chest: Effort normal.  Musculoskeletal:       Lumbar back: She exhibits decreased range of motion, tenderness, pain and spasm.       Back:  Neurological: She is alert and oriented to person, place, and time. She has normal strength and normal reflexes. No cranial nerve deficit or sensory deficit.  Pedal pulses strong and equal. Adequate circulation, good touch sensation.  Skin: Skin is warm and dry.  Psychiatric: She has a normal mood and affect. Her behavior is normal. Judgment and thought content normal.    ED Course  Procedures (including critical care time)  MDM  57 y.o. female with low back pain. I have  reviewed this patient's vital signs, nurses notes, appropriate labs and imaging.  I have discussed clinical findings and plan of care with the patient and need for follow up with Dr. Romeo Apple since symptoms have continued and she does not have a PCP. Patient voices understanding.    Medication List    TAKE these medications       cyclobenzaprine 10 MG tablet  Commonly known as:  FLEXERIL  Take 1 tablet (10 mg total) by mouth 2 (two) times daily as needed for muscle spasms.     HYDROcodone-acetaminophen 5-325 MG per tablet  Commonly known as:  NORCO/VICODIN  Take 1 tablet by mouth every 4 (four) hours as needed.      ASK your doctor about these medications       amLODipine 2.5 MG tablet  Commonly known as:  NORVASC  Take 2.5 mg by mouth daily.      levothyroxine 100 MCG tablet  Commonly known as:  SYNTHROID, LEVOTHROID  Take 100 mcg by mouth every evening.     VENTOLIN HFA 108 (90 BASE) MCG/ACT inhaler  Generic drug:  albuterol  Inhale 2 puffs into the lungs every 6 (six) hours as needed for shortness of breath.               [place], [place] 05/14/13 [phone]

## 2013-05-14 NOTE — ED Provider Notes (Signed)
Medical screening examination/treatment/procedure(s) were performed by non-physician practitioner and as supervising physician I was immediately available for consultation/collaboration.   Daren Doswell L Honor Frison, MD 05/14/13 1532 

## 2013-05-22 ENCOUNTER — Emergency Department (HOSPITAL_COMMUNITY)
Admission: EM | Admit: 2013-05-22 | Discharge: 2013-05-22 | Disposition: A | Discharge status: Home / Self Care | Payer: Medicare Other | Attending: Emergency Medicine | Admitting: Emergency Medicine

## 2013-05-22 ENCOUNTER — Emergency Department (HOSPITAL_COMMUNITY): Payer: Medicare Other

## 2013-05-22 ENCOUNTER — Encounter (HOSPITAL_COMMUNITY): Payer: Self-pay | Admitting: *Deleted

## 2013-05-22 DIAGNOSIS — R51 Headache: Secondary | ICD-10-CM | POA: Insufficient documentation

## 2013-05-22 DIAGNOSIS — J449 Chronic obstructive pulmonary disease, unspecified: Secondary | ICD-10-CM | POA: Insufficient documentation

## 2013-05-22 DIAGNOSIS — Z862 Personal history of diseases of the blood and blood-forming organs and certain disorders involving the immune mechanism: Secondary | ICD-10-CM | POA: Insufficient documentation

## 2013-05-22 DIAGNOSIS — S139XXA Sprain of joints and ligaments of unspecified parts of neck, initial encounter: Secondary | ICD-10-CM | POA: Insufficient documentation

## 2013-05-22 DIAGNOSIS — S161XXA Strain of muscle, fascia and tendon at neck level, initial encounter: Secondary | ICD-10-CM

## 2013-05-22 DIAGNOSIS — F17200 Nicotine dependence, unspecified, uncomplicated: Secondary | ICD-10-CM | POA: Insufficient documentation

## 2013-05-22 DIAGNOSIS — Z79899 Other long term (current) drug therapy: Secondary | ICD-10-CM | POA: Insufficient documentation

## 2013-05-22 DIAGNOSIS — Z8639 Personal history of other endocrine, nutritional and metabolic disease: Secondary | ICD-10-CM | POA: Insufficient documentation

## 2013-05-22 DIAGNOSIS — E079 Disorder of thyroid, unspecified: Secondary | ICD-10-CM | POA: Insufficient documentation

## 2013-05-22 DIAGNOSIS — J4489 Other specified chronic obstructive pulmonary disease: Secondary | ICD-10-CM | POA: Insufficient documentation

## 2013-05-22 DIAGNOSIS — I10 Essential (primary) hypertension: Secondary | ICD-10-CM | POA: Insufficient documentation

## 2013-05-22 MED ORDER — METHOCARBAMOL 500 MG PO TABS
1000.0000 mg | ORAL_TABLET | Freq: Once | ORAL | Status: AC
  Administered 2013-05-22: 1000 mg via ORAL
  Filled 2013-05-22: qty 2

## 2013-05-22 MED ORDER — METHOCARBAMOL 500 MG PO TABS: ORAL_TABLET | ORAL | Status: DC

## 2013-05-22 MED ORDER — HYDROCODONE-ACETAMINOPHEN 5-325 MG PO TABS: 1.0000 | ORAL_TABLET | ORAL | Status: DC | PRN

## 2013-05-22 MED ORDER — HYDROCODONE-ACETAMINOPHEN 5-325 MG PO TABS
1.0000 | ORAL_TABLET | Freq: Once | ORAL | Status: AC
  Administered 2013-05-22: 1 via ORAL
  Filled 2013-05-22: qty 1

## 2013-05-22 MED ORDER — PROMETHAZINE HCL 12.5 MG PO TABS
12.5000 mg | ORAL_TABLET | Freq: Once | ORAL | Status: AC
  Administered 2013-05-22: 12.5 mg via ORAL
  Filled 2013-05-22: qty 1

## 2013-05-22 NOTE — ED Notes (Signed)
C-collar applied
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
## 2013-05-22 NOTE — ED Provider Notes (Signed)
Medical screening examination/treatment/procedure(s) were performed by non-physician practitioner and as supervising physician I was immediately available for consultation/collaboration.    Jadyn Barge D Tyjah Hai, MD 05/22/13 1528 

## 2013-07-27 ENCOUNTER — Emergency Department (HOSPITAL_COMMUNITY)
Admission: EM | Admit: 2013-07-27 | Discharge: 2013-07-27 | Disposition: A | Discharge status: Home / Self Care | Payer: Medicare Other | Attending: Emergency Medicine | Admitting: Emergency Medicine

## 2013-07-27 ENCOUNTER — Encounter (HOSPITAL_COMMUNITY): Payer: Self-pay

## 2013-07-27 DIAGNOSIS — M2548 Effusion, other site: Secondary | ICD-10-CM | POA: Insufficient documentation

## 2013-07-27 DIAGNOSIS — Z79899 Other long term (current) drug therapy: Secondary | ICD-10-CM | POA: Insufficient documentation

## 2013-07-27 DIAGNOSIS — J449 Chronic obstructive pulmonary disease, unspecified: Secondary | ICD-10-CM | POA: Insufficient documentation

## 2013-07-27 DIAGNOSIS — J4489 Other specified chronic obstructive pulmonary disease: Secondary | ICD-10-CM | POA: Insufficient documentation

## 2013-07-27 DIAGNOSIS — F17200 Nicotine dependence, unspecified, uncomplicated: Secondary | ICD-10-CM | POA: Insufficient documentation

## 2013-07-27 DIAGNOSIS — E079 Disorder of thyroid, unspecified: Secondary | ICD-10-CM | POA: Insufficient documentation

## 2013-07-27 DIAGNOSIS — M109 Gout, unspecified: Secondary | ICD-10-CM | POA: Insufficient documentation

## 2013-07-27 DIAGNOSIS — I10 Essential (primary) hypertension: Secondary | ICD-10-CM | POA: Insufficient documentation

## 2013-07-27 DIAGNOSIS — M79609 Pain in unspecified limb: Secondary | ICD-10-CM | POA: Insufficient documentation

## 2013-07-27 DIAGNOSIS — M10071 Idiopathic gout, right ankle and foot: Secondary | ICD-10-CM

## 2013-07-27 MED ORDER — PREDNISONE 10 MG PO TABS: ORAL_TABLET | ORAL | Status: DC

## 2013-07-27 MED ORDER — HYDROCODONE-ACETAMINOPHEN 5-325 MG PO TABS: 1.0000 | ORAL_TABLET | ORAL | Status: DC | PRN

## 2013-07-27 NOTE — ED Provider Notes (Signed)
Medical screening examination/treatment/procedure(s) were performed by non-physician practitioner and as supervising physician I was immediately available for consultation/collaboration.    Laya Letendre J. Destinae Neubecker, MD 07/27/13 1515 

## 2013-07-27 NOTE — ED Notes (Signed)
Pt c/o pain and swelling in right foot that began this morning. Pt has hx of gout.

## 2013-07-27 NOTE — ED Notes (Signed)
Pt reports that she woke this am w/ her "gout flaring up" to right foot.

## 2013-07-27 NOTE — ED Provider Notes (Signed)
  CSN: [id number]     Arrival date & time 07/27/13  1410 History     First MD Initiated Contact with Patient 07/27/13 1444     Chief Complaint  Patient presents with  . Gout   (Consider location/radiation/quality/duration/timing/severity/associated sxs/prior Treatment) Patient is a 57 y.o. female presenting with lower extremity pain. The history is provided by the patient. No language interpreter was used.  Foot Pain This is a new problem. The current episode started today. The problem occurs constantly. The problem has been gradually worsening. Associated symptoms include joint swelling. Nothing aggravates the symptoms. She has tried nothing for the symptoms. The treatment provided moderate relief.  Pt complains of a history of gout  Past Medical History  Diagnosis Date  . Gout   . Hypertension   . Thyroid disease   . COPD (chronic obstructive pulmonary disease)    Past Surgical History  Procedure Laterality Date  . Cholecystectomy    . Abdominal hysterectomy    . Shoulder surgery     No family history on file. History  Substance Use Topics  . Smoking status: Current Every Day Smoker -- 1.00 packs/day    Types: Cigarettes  . Smokeless tobacco: Not on file  . Alcohol Use: No   OB History   Grav Para Term Preterm Abortions TAB SAB Ect Mult Living                 Review of Systems  Musculoskeletal: Positive for joint swelling.  All other systems reviewed and are negative.    Allergies  Tetanus toxoids; Morphine and related; Nsaids; Toradol; and Tramadol  Home Medications   Current Outpatient Rx  Name  Route  Sig  Dispense  Refill  . albuterol (VENTOLIN HFA) 108 (90 BASE) MCG/ACT inhaler   Inhalation   Inhale 2 puffs into the lungs every 6 (six) hours as needed for shortness of breath.          Marland Kitchen amLODipine (NORVASC) 2.5 MG tablet   Oral   Take 2.5 mg by mouth daily.         Marland Kitchen HYDROcodone-acetaminophen (NORCO/VICODIN) 5-325 MG per tablet   Oral   Take 1  tablet by mouth every 4 (four) hours as needed for pain.   15 tablet   0   . levothyroxine (SYNTHROID, LEVOTHROID) 100 MCG tablet   Oral   Take 100 mcg by mouth every evening.          . methocarbamol (ROBAXIN) 500 MG tablet      2 po tid for spasm/pain   30 tablet   0    BP 109/79  Pulse 80  Temp(Src) 98.1 F (36.7 C) (Oral)  Resp 20  Ht 5\' 2"  (1.575 m)  Wt 108 lb 1 oz (49.017 kg)  BMI 19.76 kg/m2  SpO2 96% Physical Exam  Vitals reviewed. Constitutional: She is oriented to person, place, and time.  Musculoskeletal: She exhibits tenderness.  Tender right 1st toe  Neurological: She is alert and oriented to person, place, and time. She has normal reflexes.  Skin: Skin is warm.  Psychiatric: She has a normal mood and affect.    ED Course   Procedures (including critical care time)  Labs Reviewed - No data to display No results found. 1. Gouty arthritis of toe, right     MDM  Prednisone taper,  hydrocodone  Elson Areas, PA-C 07/27/13 1454  Lonia Skinner [place], [place] 07/27/13 1457

## 2013-08-14 ENCOUNTER — Encounter (HOSPITAL_COMMUNITY): Payer: Self-pay | Admitting: Emergency Medicine

## 2013-08-14 ENCOUNTER — Emergency Department (HOSPITAL_COMMUNITY)
Admission: EM | Admit: 2013-08-14 | Discharge: 2013-08-14 | Disposition: A | Discharge status: Home / Self Care | Payer: Medicare Other | Attending: Emergency Medicine | Admitting: Emergency Medicine

## 2013-08-14 DIAGNOSIS — Z79899 Other long term (current) drug therapy: Secondary | ICD-10-CM | POA: Insufficient documentation

## 2013-08-14 DIAGNOSIS — J4489 Other specified chronic obstructive pulmonary disease: Secondary | ICD-10-CM | POA: Insufficient documentation

## 2013-08-14 DIAGNOSIS — F17200 Nicotine dependence, unspecified, uncomplicated: Secondary | ICD-10-CM | POA: Insufficient documentation

## 2013-08-14 DIAGNOSIS — M109 Gout, unspecified: Secondary | ICD-10-CM | POA: Insufficient documentation

## 2013-08-14 DIAGNOSIS — E079 Disorder of thyroid, unspecified: Secondary | ICD-10-CM | POA: Insufficient documentation

## 2013-08-14 DIAGNOSIS — I10 Essential (primary) hypertension: Secondary | ICD-10-CM | POA: Insufficient documentation

## 2013-08-14 DIAGNOSIS — J449 Chronic obstructive pulmonary disease, unspecified: Secondary | ICD-10-CM | POA: Insufficient documentation

## 2013-08-14 MED ORDER — PREDNISONE (PAK) 10 MG PO TABS: 10.0000 mg | ORAL_TABLET | Freq: Every day | ORAL | Status: DC

## 2013-08-14 MED ORDER — HYDROCODONE-ACETAMINOPHEN 5-325 MG PO TABS: 1.0000 | ORAL_TABLET | ORAL | Status: DC | PRN

## 2013-08-14 NOTE — ED Provider Notes (Signed)
CSN: [id number]     Arrival date & time 08/14/13  1824 History     First MD Initiated Contact with Patient 08/14/13 1843     Chief Complaint  Patient presents with  . Foot Pain   (Consider location/radiation/quality/duration/timing/severity/associated sxs/prior Treatment) Patient is a 57 y.o. female presenting with lower extremity pain. The history is provided by the patient.  Foot Pain This is a recurrent problem. The current episode started today. The problem occurs constantly. The problem has been gradually worsening. Pertinent negatives include no fever, headaches, nausea or vomiting. The symptoms are aggravated by standing and walking.   Priscilla Lindsey is a 57 y.o. female who presents to the ED with right foot pain that started earlier today. She has a history of gout and this feels similar to other flare ups. Patient is in the process of getting a primary care doctor.   Past Medical History  Diagnosis Date  . Gout   . Hypertension   . Thyroid disease   . COPD (chronic obstructive pulmonary disease)    Past Surgical History  Procedure Laterality Date  . Cholecystectomy    . Abdominal hysterectomy    . Shoulder surgery     History reviewed. No pertinent family history. History  Substance Use Topics  . Smoking status: Current Every Day Smoker -- 1.00 packs/day    Types: Cigarettes  . Smokeless tobacco: Not on file  . Alcohol Use: No   OB History   Grav Para Term Preterm Abortions TAB SAB Ect Mult Living                 Review of Systems  Constitutional: Negative for fever.  Respiratory: Negative for chest tightness.   Gastrointestinal: Negative for nausea and vomiting.  Musculoskeletal:       Right great toe pain that radiates to the foot  Skin: Negative for wound.  Neurological: Negative for headaches.  Psychiatric/Behavioral: The patient is not nervous/anxious.     Allergies  Tetanus toxoids; Morphine and related; Nsaids; Toradol; and Tramadol  Home  Medications   Current Outpatient Rx  Name  Route  Sig  Dispense  Refill  . albuterol (VENTOLIN HFA) 108 (90 BASE) MCG/ACT inhaler   Inhalation   Inhale 2 puffs into the lungs every 6 (six) hours as needed for shortness of breath.          Marland Kitchen amLODipine (NORVASC) 2.5 MG tablet   Oral   Take 2.5 mg by mouth at bedtime.          Marland Kitchen HYDROcodone-acetaminophen (NORCO/VICODIN) 5-325 MG per tablet   Oral   Take 1 tablet by mouth every 4 (four) hours as needed for pain.   16 tablet   0   . levothyroxine (SYNTHROID, LEVOTHROID) 100 MCG tablet   Oral   Take 100 mcg by mouth every evening.          . predniSONE (DELTASONE) 10 MG tablet      6,5,4,3,2,1 taper   21 tablet   0    BP 149/87  Pulse 81  Temp(Src) 98.4 F (36.9 C) (Oral)  Resp 18  SpO2 98% Physical Exam  Nursing note and vitals reviewed. Constitutional: She is oriented to person, place, and time. She appears well-developed and well-nourished.  HENT:  Head: Normocephalic.  Eyes: EOM are normal.  Neck: Normal range of motion. Neck supple.  Cardiovascular: Normal rate.   Pulmonary/Chest: Effort normal.  Musculoskeletal:       Right foot: She  exhibits tenderness and swelling. She exhibits normal range of motion, no deformity and no laceration.       Feet:  Pedal pulse strong, adequate circulation, good touch sensation.  Neurological: She is alert and oriented to person, place, and time. No cranial nerve deficit.  Skin: Skin is warm and dry.  Psychiatric: She has a normal mood and affect. Her behavior is normal.    ED Course   Procedures   MDM  57 y.o. female with right great toe pain and hx of gout. Will treat with prednisone taper and pain medication.  Discussed with the patient and all questioned fully answered. She will return if any problems arise.    Medication List    STOP taking these medications       predniSONE 10 MG tablet  Commonly known as:  DELTASONE  Replaced by:  predniSONE 10 MG tablet       TAKE these medications       HYDROcodone-acetaminophen 5-325 MG per tablet  Commonly known as:  NORCO/VICODIN  Take 1 tablet by mouth every 4 (four) hours as needed.     predniSONE 10 MG tablet  Commonly known as:  STERAPRED UNI-PAK  Take 1 tablet (10 mg total) by mouth daily. Take 6 tablets day one, then 5, 4, 3, 2, 1      ASK your doctor about these medications       amLODipine 2.5 MG tablet  Commonly known as:  NORVASC  Take 2.5 mg by mouth at bedtime.     levothyroxine 100 MCG tablet  Commonly known as:  SYNTHROID, LEVOTHROID  Take 100 mcg by mouth every evening.     VENTOLIN HFA 108 (90 BASE) MCG/ACT inhaler  Generic drug:  albuterol  Inhale 2 puffs into the lungs every 6 (six) hours as needed for shortness of breath.         [facility] Orlene Och, [place] 08/14/13 1905

## 2013-08-14 NOTE — ED Notes (Signed)
Pt c/o right foot pain that began earlier today. Pt denies any injury. Pt has hx of gout and states these symptoms are similar to past flare ups.

## 2013-08-15 NOTE — ED Provider Notes (Signed)
Medical screening examination/treatment/procedure(s) were performed by non-physician practitioner and as supervising physician I was immediately available for consultation/collaboration.  Hurman Horn, MD 08/15/13 1245

## 2013-09-08 ENCOUNTER — Emergency Department (HOSPITAL_COMMUNITY)
Admission: EM | Admit: 2013-09-08 | Discharge: 2013-09-08 | Disposition: A | Discharge status: Home / Self Care | Payer: Medicare Other

## 2013-09-09 ENCOUNTER — Emergency Department (HOSPITAL_COMMUNITY)
Admission: EM | Admit: 2013-09-09 | Discharge: 2013-09-09 | Disposition: A | Discharge status: Home / Self Care | Payer: Medicare Other | Attending: Emergency Medicine | Admitting: Emergency Medicine

## 2013-09-09 ENCOUNTER — Encounter (HOSPITAL_COMMUNITY): Payer: Self-pay

## 2013-09-09 DIAGNOSIS — E079 Disorder of thyroid, unspecified: Secondary | ICD-10-CM | POA: Insufficient documentation

## 2013-09-09 DIAGNOSIS — M25511 Pain in right shoulder: Secondary | ICD-10-CM

## 2013-09-09 DIAGNOSIS — IMO0001 Reserved for inherently not codable concepts without codable children: Secondary | ICD-10-CM | POA: Insufficient documentation

## 2013-09-09 DIAGNOSIS — I10 Essential (primary) hypertension: Secondary | ICD-10-CM | POA: Insufficient documentation

## 2013-09-09 DIAGNOSIS — J4489 Other specified chronic obstructive pulmonary disease: Secondary | ICD-10-CM | POA: Insufficient documentation

## 2013-09-09 DIAGNOSIS — Z8619 Personal history of other infectious and parasitic diseases: Secondary | ICD-10-CM | POA: Insufficient documentation

## 2013-09-09 DIAGNOSIS — F17200 Nicotine dependence, unspecified, uncomplicated: Secondary | ICD-10-CM | POA: Insufficient documentation

## 2013-09-09 DIAGNOSIS — M25519 Pain in unspecified shoulder: Secondary | ICD-10-CM | POA: Insufficient documentation

## 2013-09-09 DIAGNOSIS — J449 Chronic obstructive pulmonary disease, unspecified: Secondary | ICD-10-CM | POA: Insufficient documentation

## 2013-09-09 DIAGNOSIS — R05 Cough: Secondary | ICD-10-CM | POA: Insufficient documentation

## 2013-09-09 DIAGNOSIS — Z79899 Other long term (current) drug therapy: Secondary | ICD-10-CM | POA: Insufficient documentation

## 2013-09-09 DIAGNOSIS — R059 Cough, unspecified: Secondary | ICD-10-CM | POA: Insufficient documentation

## 2013-09-09 DIAGNOSIS — M797 Fibromyalgia: Secondary | ICD-10-CM

## 2013-09-09 MED ORDER — HYDROCODONE-ACETAMINOPHEN 5-325 MG PO TABS: 1.0000 | ORAL_TABLET | ORAL | Status: DC | PRN

## 2013-09-09 MED ORDER — HYDROCODONE-ACETAMINOPHEN 5-325 MG PO TABS
2.0000 | ORAL_TABLET | Freq: Once | ORAL | Status: AC
  Administered 2013-09-09: 2 via ORAL
  Filled 2013-09-09: qty 2

## 2013-09-09 MED ORDER — PREDNISONE 50 MG PO TABS
60.0000 mg | ORAL_TABLET | Freq: Once | ORAL | Status: AC
  Administered 2013-09-09: 16:00:00 60 mg via ORAL
  Filled 2013-09-09: qty 1

## 2013-09-09 MED ORDER — PROMETHAZINE HCL 12.5 MG PO TABS
12.5000 mg | ORAL_TABLET | Freq: Once | ORAL | Status: AC
  Administered 2013-09-09: 12.5 mg via ORAL
  Filled 2013-09-09: qty 1

## 2013-09-09 MED ORDER — PREDNISONE 10 MG PO TABS: 20.0000 mg | ORAL_TABLET | Freq: Every day | ORAL | Status: DC

## 2013-09-09 NOTE — ED Provider Notes (Signed)
CSN: [id number]     Arrival date & time 09/09/13  1348 History   First MD Initiated Contact with Patient 09/09/13 1427     Chief Complaint  Patient presents with  . Extremity Pain   (Consider location/radiation/quality/duration/timing/severity/associated sxs/prior Treatment) Patient is a 57 y.o. female presenting with extremity pain. The history is provided by the patient.  Extremity Pain This is a recurrent problem. The current episode started in the past 7 days. The problem occurs daily. The problem has been gradually worsening. Associated symptoms include arthralgias and coughing. Pertinent negatives include no abdominal pain, chest pain, neck pain, numbness or rash. Exacerbated by: movement. She has tried nothing for the symptoms. The treatment provided no relief.    Past Medical History  Diagnosis Date  . Gout   . Hypertension   . Thyroid disease   . COPD (chronic obstructive pulmonary disease)    Past Surgical History  Procedure Laterality Date  . Cholecystectomy    . Abdominal hysterectomy    . Shoulder surgery     No family history on file. History  Substance Use Topics  . Smoking status: Current Every Day Smoker -- 1.00 packs/day    Types: Cigarettes  . Smokeless tobacco: Not on file  . Alcohol Use: No   OB History   Grav Para Term Preterm Abortions TAB SAB Ect Mult Living                 Review of Systems  Constitutional: Negative for activity change.       All ROS Neg except as noted in HPI  HENT: Negative for nosebleeds and neck pain.   Eyes: Negative for photophobia and discharge.  Respiratory: Positive for cough. Negative for shortness of breath and wheezing.   Cardiovascular: Negative for chest pain and palpitations.  Gastrointestinal: Negative for abdominal pain and blood in stool.  Genitourinary: Negative for dysuria, frequency and hematuria.  Musculoskeletal: Positive for arthralgias. Negative for back pain.  Skin: Negative.  Negative for rash.   Neurological: Negative for dizziness, seizures, speech difficulty and numbness.  Psychiatric/Behavioral: Negative for hallucinations and confusion.    Allergies  Tetanus toxoids; Morphine and related; Nsaids; Toradol; and Tramadol  Home Medications   Current Outpatient Rx  Name  Route  Sig  Dispense  Refill  . albuterol (VENTOLIN HFA) 108 (90 BASE) MCG/ACT inhaler   Inhalation   Inhale 2 puffs into the lungs every 6 (six) hours as needed for shortness of breath.          Marland Kitchen amLODipine (NORVASC) 2.5 MG tablet   Oral   Take 2.5 mg by mouth at bedtime.          Marland Kitchen amoxicillin (AMOXIL) 400 MG/5ML suspension   Oral   Take 400 mg by mouth 3 (three) times daily.         Marland Kitchen levothyroxine (SYNTHROID, LEVOTHROID) 100 MCG tablet   Oral   Take 100 mcg by mouth every evening.           BP 146/86  Pulse 76  Temp(Src) 98.3 F (36.8 C) (Oral)  Resp 16  SpO2 95% Physical Exam  Nursing note and vitals reviewed. Constitutional: She is oriented to person, place, and time. She appears well-developed and well-nourished.  Non-toxic appearance.  HENT:  Head: Normocephalic.  Right Ear: Tympanic membrane and external ear normal.  Left Ear: Tympanic membrane and external ear normal.  Eyes: EOM and lids are normal. Pupils are equal, round, and reactive to light.  Neck: Normal range of motion. Neck supple. Carotid bruit is not present.  Cardiovascular: Normal rate, regular rhythm, normal heart sounds, intact distal pulses and normal pulses.   Pulmonary/Chest: Breath sounds normal. No respiratory distress.  Soft wheezes.  Abdominal: Soft. Bowel sounds are normal. There is no tenderness. There is no guarding.  Musculoskeletal:  Pain to light palpation of the posterior neck to the upper trapezious area. Pain with attempted ROm. Mild crepitus present. No hot areas.  Lymphadenopathy:       Head (right side): No submandibular adenopathy present.       Head (left side): No submandibular  adenopathy present.    She has no cervical adenopathy.  Neurological: She is alert and oriented to person, place, and time. She has normal strength. No cranial nerve deficit or sensory deficit.  Skin: Skin is warm and dry.  Psychiatric: She has a normal mood and affect. Her speech is normal.    ED Course  Procedures (including critical care time) Labs Review Labs Reviewed - No data to display Imaging Review No results found.  MDM  No diagnosis found. *I have reviewed nursing notes, vital signs, and all appropriate lab and imaging results for this patient.**  Pt has pain with mild touch and with attempted movement. No trauma. No deformity.No hot joints. Plan - Pt advised to see her primary MD for evaluation of her fibromyallgia. Vital signs stable. Rx for prednisone taper and norco given to the patient.   Kathie Dike, PA-C 09/11/13 [phone]

## 2013-09-12 NOTE — ED Provider Notes (Signed)
Medical screening examination/treatment/procedure(s) were performed by non-physician practitioner and as supervising physician I was immediately available for consultation/collaboration.    Candyce Churn, MD 09/12/13 [phone]

## 2014-03-16 ENCOUNTER — Encounter (HOSPITAL_COMMUNITY): Payer: Self-pay | Admitting: Emergency Medicine

## 2014-03-16 ENCOUNTER — Emergency Department (HOSPITAL_COMMUNITY)
Admission: EM | Admit: 2014-03-16 | Discharge: 2014-03-16 | Disposition: A | Discharge status: Home / Self Care | Payer: Medicare Other | Attending: Emergency Medicine | Admitting: Emergency Medicine

## 2014-03-16 ENCOUNTER — Emergency Department (HOSPITAL_COMMUNITY): Payer: Medicare Other

## 2014-03-16 DIAGNOSIS — R0789 Other chest pain: Secondary | ICD-10-CM

## 2014-03-16 DIAGNOSIS — F17200 Nicotine dependence, unspecified, uncomplicated: Secondary | ICD-10-CM | POA: Insufficient documentation

## 2014-03-16 DIAGNOSIS — I10 Essential (primary) hypertension: Secondary | ICD-10-CM | POA: Insufficient documentation

## 2014-03-16 DIAGNOSIS — S298XXA Other specified injuries of thorax, initial encounter: Secondary | ICD-10-CM | POA: Insufficient documentation

## 2014-03-16 DIAGNOSIS — X500XXA Overexertion from strenuous movement or load, initial encounter: Secondary | ICD-10-CM | POA: Insufficient documentation

## 2014-03-16 DIAGNOSIS — Z79899 Other long term (current) drug therapy: Secondary | ICD-10-CM | POA: Insufficient documentation

## 2014-03-16 DIAGNOSIS — J449 Chronic obstructive pulmonary disease, unspecified: Secondary | ICD-10-CM | POA: Insufficient documentation

## 2014-03-16 DIAGNOSIS — J4489 Other specified chronic obstructive pulmonary disease: Secondary | ICD-10-CM | POA: Insufficient documentation

## 2014-03-16 DIAGNOSIS — Y9289 Other specified places as the place of occurrence of the external cause: Secondary | ICD-10-CM | POA: Insufficient documentation

## 2014-03-16 DIAGNOSIS — E079 Disorder of thyroid, unspecified: Secondary | ICD-10-CM | POA: Insufficient documentation

## 2014-03-16 DIAGNOSIS — Y9389 Activity, other specified: Secondary | ICD-10-CM | POA: Insufficient documentation

## 2014-03-16 MED ORDER — HYDROCODONE-ACETAMINOPHEN 5-325 MG PO TABS: 1.0000 | ORAL_TABLET | Freq: Four times a day (QID) | ORAL | Status: DC | PRN

## 2014-03-16 NOTE — Discharge Instructions (Signed)
Chest Wall Pain Chest wall pain is pain in or around the bones and muscles of your chest. It may take up to 6 weeks to get better. It may take longer if you must stay physically active in your work and activities.  CAUSES  Chest wall pain may happen on its own. However, it may be caused by:  A viral illness like the flu.  Injury.  Coughing.  Exercise.  Arthritis.  Fibromyalgia.  Shingles. HOME CARE INSTRUCTIONS   Avoid overtiring physical activity. Try not to strain or perform activities that cause pain. This includes any activities using your chest or your abdominal and side muscles, especially if heavy weights are used.  Put ice on the sore area.  Put ice in a plastic bag.  Place a towel between your skin and the bag.  Leave the ice on for 15-20 minutes per hour while awake for the first 2 days.  Only take over-the-counter or prescription medicines for pain, discomfort, or fever as directed by your caregiver. SEEK IMMEDIATE MEDICAL CARE IF:   Your pain increases, or you are very uncomfortable.  You have a fever.  Your chest pain becomes worse.  You have new, unexplained symptoms.  You have nausea or vomiting.  You feel sweaty or lightheaded.  You have a cough with phlegm (sputum), or you cough up blood. MAKE SURE YOU:   Understand these instructions.  Will watch your condition.  Will get help right away if you are not doing well or get worse. Document Released: 12/09/2005 Document Revised: 03/02/2012 Document Reviewed: 08/05/2011 [facility] Patient Information 2014 [place], [place].   Return if symptoms worsen Return if shortness of breath, fever or difficulty breathing

## 2014-03-16 NOTE — ED Provider Notes (Signed)
CSN: [id number]     Arrival date & time 03/16/14  1339 History   First MD Initiated Contact with Patient 03/16/14 1359     Chief Complaint  Patient presents with  . Rib Injury     (Consider location/radiation/quality/duration/timing/severity/associated sxs/prior Treatment) The history is provided by the patient. No language interpreter was used.   patient is a 8 are old female who presents to the ER today with left rib pain. She reports that she was throwing wood, fire and felt a pop on her left side. She reports that her left ribs are hurting extending into her left axilla. She reports it hurts to take a deep breath. She denies fever, chills or shortness of breath. She denies wheezing. She endorses a history of COPD but reports that it has been pretty well controlled here recently.   Past Medical History  Diagnosis Date  . Gout   . Hypertension   . Thyroid disease   . COPD (chronic obstructive pulmonary disease)    Past Surgical History  Procedure Laterality Date  . Cholecystectomy    . Abdominal hysterectomy    . Shoulder surgery     No family history on file. History  Substance Use Topics  . Smoking status: Current Every Day Smoker -- 1.00 packs/day    Types: Cigarettes  . Smokeless tobacco: Not on file  . Alcohol Use: No   OB History   Grav Para Term Preterm Abortions TAB SAB Ect Mult Living                 Review of Systems  Constitutional: Negative for fever and chills.  Respiratory: Negative for chest tightness, shortness of breath and wheezing.        Left rib pain  Gastrointestinal: Negative for nausea, vomiting, abdominal pain and diarrhea.  Neurological: Negative for dizziness, weakness, numbness and headaches.  All other systems reviewed and are negative.      Allergies  Tetanus toxoids; Morphine and related; Nsaids; Toradol; and Tramadol  Home Medications   Current Outpatient Rx  Name  Route  Sig  Dispense  Refill  . albuterol (VENTOLIN HFA) 108  (90 BASE) MCG/ACT inhaler   Inhalation   Inhale 2 puffs into the lungs every 6 (six) hours as needed for shortness of breath.          Marland Kitchen amLODipine (NORVASC) 2.5 MG tablet   Oral   Take 2.5 mg by mouth at bedtime.          Marland Kitchen levothyroxine (SYNTHROID, LEVOTHROID) 150 MCG tablet   Oral   Take 150 mcg by mouth daily before breakfast.         . mirtazapine (REMERON) 30 MG tablet   Oral   Take 1 tablet by mouth at bedtime.         Marland Kitchen HYDROcodone-acetaminophen (NORCO/VICODIN) 5-325 MG per tablet   Oral   Take 1-2 tablets by mouth every 6 (six) hours as needed.   15 tablet   0    BP 130/70  Pulse 103  Temp(Src) 98.3 F (36.8 C) (Oral)  Resp 18  Ht 5\' 3"  (1.6 m)  Wt 108 lb (48.988 kg)  BMI 19.14 kg/m2  SpO2 94% Physical Exam  Nursing note and vitals reviewed. Constitutional: She is oriented to person, place, and time. She appears well-developed and well-nourished. No distress.  HENT:  Head: Normocephalic and atraumatic.  Eyes: Conjunctivae and EOM are normal.  Neck: Normal range of motion. Neck supple. No JVD present.  No tracheal deviation present. No thyromegaly present.  Cardiovascular: Normal rate, regular rhythm and normal heart sounds.   Pulmonary/Chest: Effort normal and breath sounds normal. No respiratory distress. She has no wheezes. She has no rales. She exhibits tenderness.  Left rib pain, extending into left axilla  Musculoskeletal: Normal range of motion.  Lymphadenopathy:    She has no cervical adenopathy.  Neurological: She is alert and oriented to person, place, and time.  Skin: Skin is warm and dry.  Psychiatric: She has a normal mood and affect. Her behavior is normal. Judgment and thought content normal.    ED Course  Procedures (including critical care time) Labs Review Labs Reviewed - No data to display Imaging Review No results found.   EKG Interpretation None      MDM   Final diagnoses:  Chest wall pain   Negative chest x-ray  and left ribs x-rays. No shortness of breath or difficulty breathing. Bilateral breath sounds clear and equal. Pt in no distress. Probable muscle strain from throwing wood. C/O soreness in left ribs. Return precautions given.         Elisha Headland, NP 03/18/14 [phone]

## 2014-03-16 NOTE — ED Notes (Signed)
Patient given discharge instruction, verbalized understand. Patient ambulatory out of the department.  

## 2014-03-16 NOTE — ED Notes (Signed)
Pt states she was throwing some wood in the fire and felt something pop in her left rib area

## 2014-03-23 ENCOUNTER — Emergency Department (HOSPITAL_COMMUNITY)
Admission: EM | Admit: 2014-03-23 | Discharge: 2014-03-23 | Disposition: A | Discharge status: Home / Self Care | Payer: Medicare Other | Attending: Emergency Medicine | Admitting: Emergency Medicine

## 2014-03-23 ENCOUNTER — Emergency Department (HOSPITAL_COMMUNITY): Payer: Medicare Other

## 2014-03-23 ENCOUNTER — Encounter (HOSPITAL_COMMUNITY): Payer: Self-pay | Admitting: Emergency Medicine

## 2014-03-23 DIAGNOSIS — G8911 Acute pain due to trauma: Secondary | ICD-10-CM | POA: Insufficient documentation

## 2014-03-23 DIAGNOSIS — E079 Disorder of thyroid, unspecified: Secondary | ICD-10-CM | POA: Insufficient documentation

## 2014-03-23 DIAGNOSIS — J4489 Other specified chronic obstructive pulmonary disease: Secondary | ICD-10-CM | POA: Insufficient documentation

## 2014-03-23 DIAGNOSIS — J449 Chronic obstructive pulmonary disease, unspecified: Secondary | ICD-10-CM | POA: Insufficient documentation

## 2014-03-23 DIAGNOSIS — Z79899 Other long term (current) drug therapy: Secondary | ICD-10-CM | POA: Insufficient documentation

## 2014-03-23 DIAGNOSIS — R071 Chest pain on breathing: Secondary | ICD-10-CM | POA: Insufficient documentation

## 2014-03-23 DIAGNOSIS — R05 Cough: Secondary | ICD-10-CM | POA: Insufficient documentation

## 2014-03-23 DIAGNOSIS — R110 Nausea: Secondary | ICD-10-CM | POA: Insufficient documentation

## 2014-03-23 DIAGNOSIS — I10 Essential (primary) hypertension: Secondary | ICD-10-CM | POA: Insufficient documentation

## 2014-03-23 DIAGNOSIS — R059 Cough, unspecified: Secondary | ICD-10-CM | POA: Insufficient documentation

## 2014-03-23 DIAGNOSIS — F17200 Nicotine dependence, unspecified, uncomplicated: Secondary | ICD-10-CM | POA: Insufficient documentation

## 2014-03-23 DIAGNOSIS — R0781 Pleurodynia: Secondary | ICD-10-CM

## 2014-03-23 MED ORDER — ACETAMINOPHEN-CODEINE #3 300-30 MG PO TABS: 1.0000 | ORAL_TABLET | Freq: Four times a day (QID) | ORAL | Status: DC | PRN

## 2014-03-23 MED ORDER — ONDANSETRON 4 MG PO TBDP: 4.0000 mg | ORAL_TABLET | Freq: Four times a day (QID) | ORAL | Status: DC | PRN

## 2014-03-23 MED ORDER — LORAZEPAM 1 MG PO TABS: 1.0000 mg | ORAL_TABLET | Freq: Four times a day (QID) | ORAL | Status: DC | PRN

## 2014-03-23 NOTE — ED Provider Notes (Signed)
CSN: [id number]     Arrival date & time 03/23/14  1302 History   First MD Initiated Contact with Patient 03/23/14 1334     Chief Complaint  Patient presents with  . Rib pain      (Consider location/radiation/quality/duration/timing/severity/associated sxs/prior Treatment) HPI Comments: Patient presents to the emergency department with the complaint of left rib area pain for about 2 weeks. The patient states that most recently she coughed and turned a doorknob AND time and felt a pop in the ribs on her left side. It is of note that on may 25th the patient was seen in the emergency department for a problem with another rib injury in the same area. The patient denies any hemoptysis. She denies any changes in her urine. She's not had any high fever. She states however that the pain has continues to get worse. She presents now for additional evaluation and management of this problem.  The history is provided by the patient.    Past Medical History  Diagnosis Date  . Gout   . Hypertension   . Thyroid disease   . COPD (chronic obstructive pulmonary disease)    Past Surgical History  Procedure Laterality Date  . Cholecystectomy    . Abdominal hysterectomy    . Shoulder surgery     No family history on file. History  Substance Use Topics  . Smoking status: Current Every Day Smoker -- 1.00 packs/day    Types: Cigarettes  . Smokeless tobacco: Not on file  . Alcohol Use: No   OB History   Grav Para Term Preterm Abortions TAB SAB Ect Mult Living                 Review of Systems  Constitutional: Negative for activity change.       All ROS Neg except as noted in HPI  HENT: Negative for nosebleeds.   Eyes: Negative for photophobia and discharge.  Respiratory: Positive for cough. Negative for shortness of breath and wheezing.        Chest wall pain  Cardiovascular: Negative for chest pain and palpitations.  Gastrointestinal: Negative for abdominal pain and blood in stool.   Genitourinary: Negative for dysuria, frequency and hematuria.  Musculoskeletal: Negative for arthralgias, back pain and neck pain.  Skin: Negative.   Neurological: Negative for dizziness, seizures and speech difficulty.  Psychiatric/Behavioral: Negative for hallucinations and confusion.      Allergies  Tetanus toxoids; Morphine and related; Nsaids; Toradol; and Tramadol  Home Medications   Current Outpatient Rx  Name  Route  Sig  Dispense  Refill  . albuterol (VENTOLIN HFA) 108 (90 BASE) MCG/ACT inhaler   Inhalation   Inhale 2 puffs into the lungs every 6 (six) hours as needed for shortness of breath.          Marland Kitchen amLODipine (NORVASC) 2.5 MG tablet   Oral   Take 2.5 mg by mouth at bedtime.          Marland Kitchen HYDROcodone-acetaminophen (NORCO/VICODIN) 5-325 MG per tablet   Oral   Take 1-2 tablets by mouth every 6 (six) hours as needed. pain         . levothyroxine (SYNTHROID, LEVOTHROID) 150 MCG tablet   Oral   Take 150 mcg by mouth daily before breakfast.         . mirtazapine (REMERON) 30 MG tablet   Oral   Take 1 tablet by mouth at bedtime.         Marland Kitchen acetaminophen-codeine (TYLENOL #  3) 300-30 MG per tablet   Oral   Take 1-2 tablets by mouth every 6 (six) hours as needed for moderate pain.   20 tablet   0   . LORazepam (ATIVAN) 1 MG tablet   Oral   Take 1 tablet (1 mg total) by mouth every 6 (six) hours as needed for anxiety (rib pain/spasm).   12 tablet   0   . ondansetron (ZOFRAN ODT) 4 MG disintegrating tablet   Oral   Take 1 tablet (4 mg total) by mouth every 6 (six) hours as needed for nausea or vomiting.   12 tablet   0    BP 111/59  Pulse 97  Temp(Src) 97.5 F (36.4 C) (Oral)  Resp 18  Ht 5\' 4"  (1.626 m)  Wt 111 lb (50.349 kg)  BMI 19.04 kg/m2  SpO2 98% Physical Exam  Nursing note and vitals reviewed. Constitutional: She is oriented to person, place, and time. She appears well-developed and well-nourished.  Non-toxic appearance.  HENT:   Head: Normocephalic.  Right Ear: Tympanic membrane and external ear normal.  Left Ear: Tympanic membrane and external ear normal.  Eyes: EOM and lids are normal. Pupils are equal, round, and reactive to light.  Neck: Normal range of motion. Neck supple. Carotid bruit is not present.  Cardiovascular: Normal rate, regular rhythm, normal heart sounds, intact distal pulses and normal pulses.   Pulmonary/Chest: Breath sounds normal. No respiratory distress.  Pain to palpation of the left mid and lower chest wall in the rib area. No bruising noted. No deformity appreciated. There is symmetrical rise and fall of the chest.  Patient speaks in complete sentences.  Abdominal: Soft. Bowel sounds are normal. There is no tenderness. There is no guarding.  Musculoskeletal: Normal range of motion.  Lymphadenopathy:       Head (right side): No submandibular adenopathy present.       Head (left side): No submandibular adenopathy present.    She has no cervical adenopathy.  Neurological: She is alert and oriented to person, place, and time. She has normal strength. No cranial nerve deficit or sensory deficit.  Skin: Skin is warm and dry.  Psychiatric: She has a normal mood and affect. Her speech is normal.    ED Course  Procedures (including critical care time) Labs Review Labs Reviewed - No data to display Imaging Review Dg Ribs Unilateral W/chest Left  03/23/2014   CLINICAL DATA:  Left rib pain after coughing.  EXAM: LEFT RIBS AND CHEST - 3+ VIEW  COMPARISON:  03/16/2014  FINDINGS: The lungs are clear without focal infiltrate, edema, pneumothorax or pleural effusion. Hyper expansion is consistent with emphysema. The cardiopericardial silhouette is within normal limits for size.  The oblique views of the left ribs show no evidence for acute rib fracture.  IMPRESSION: No evidence for acute left-sided rib fracture.   Electronically Signed   By: Misty Stanley M.D.   On: 03/23/2014 14:36     EKG  Interpretation None      MDM X-ray of the left ribs and chest is read as negative for acute fracture or pulmonary related problem. The vital signs are well within normal limits. The pulse oximetry is 98% on room air. The patient is given an incentive spirometer to prevent any atelectasis or other lung problems. Prescription for Tylenol codeine, Ativan, and Zofran given to the patient and she has nausea with several medications. Patient strongly advised to see her primary physician for evaluation of this left rib  and chest wall pain.    Final diagnoses:  Rib pain on left side    **I have reviewed nursing notes, vital signs, and all appropriate lab and imaging results for this patient.Lenox Ahr, PA-C 03/23/14 [phone]

## 2014-03-23 NOTE — Discharge Instructions (Signed)
Your x-rays on March 25 were negative for fracture or injury to your lungs. Your x-rays today are negative for rib fracture or lung problems. Please use lorazepam every 6 hours for rib pain and spasm. Please use Tylenol codeine for pain if needed. Please take this medication with food. Use Zofran if needed for nausea related to these medications. Please see your primary doctor for any additional evaluation and for pain control. Chest Wall Pain Chest wall pain is pain in or around the bones and muscles of your chest. It may take up to 6 weeks to get better. It may take longer if you must stay physically active in your work and activities.  CAUSES  Chest wall pain may happen on its own. However, it may be caused by:  A viral illness like the flu.  Injury.  Coughing.  Exercise.  Arthritis.  Fibromyalgia.  Shingles. HOME CARE INSTRUCTIONS   Avoid overtiring physical activity. Try not to strain or perform activities that cause pain. This includes any activities using your chest or your abdominal and side muscles, especially if heavy weights are used.  Put ice on the sore area.  Put ice in a plastic bag.  Place a towel between your skin and the bag.  Leave the ice on for 15-20 minutes per hour while awake for the first 2 days.  Only take over-the-counter or prescription medicines for pain, discomfort, or fever as directed by your caregiver. SEEK IMMEDIATE MEDICAL CARE IF:   Your pain increases, or you are very uncomfortable.  You have a fever.  Your chest pain becomes worse.  You have new, unexplained symptoms.  You have nausea or vomiting.  You feel sweaty or lightheaded.  You have a cough with phlegm (sputum), or you cough up blood. MAKE SURE YOU:   Understand these instructions.  Will watch your condition.  Will get help right away if you are not doing well or get worse. Document Released: 12/09/2005 Document Revised: 03/02/2012 Document Reviewed:  08/05/2011 [facility] Patient Information 2014 [place], [place].

## 2014-03-23 NOTE — ED Provider Notes (Signed)
Medical screening examination/treatment/procedure(s) were performed by non-physician practitioner and as supervising physician I was immediately available for consultation/collaboration.   EKG Interpretation None        Sharyon Cable, MD 03/23/14 1601

## 2014-03-23 NOTE — ED Notes (Signed)
Went to discharge patient, while reviewing discharge paperwork and prescriptions, patient reports "I cannot take tylenol with codeine. It makes me too sick." Explained to patient that she would also receive prescription for zofran for nausea to prevent nausea due to pain medication. Patient reported did not want to take tylenol with codeine even with prescription for nausea medication. Lily Kocher notified. In room speaking with patient. Patient now agreed to take tylenol with codeine prescription.

## 2014-03-23 NOTE — ED Notes (Signed)
Respiratory therapy paged for incentive spirometer.

## 2014-03-23 NOTE — ED Notes (Signed)
Pt states left lower rib pain x 2 weeks, no known injury. Cough present. Pt states she was turned a door knob today and cough at the same time and felt a "pop" to rib area.

## 2014-03-23 NOTE — ED Provider Notes (Signed)
Medical screening examination/treatment/procedure(s) were performed by non-physician practitioner and as supervising physician I was immediately available for consultation/collaboration.   EKG Interpretation None        Maudry Diego, MD 03/23/14 [phone]

## 2014-04-12 ENCOUNTER — Emergency Department (HOSPITAL_COMMUNITY)
Admission: EM | Admit: 2014-04-12 | Discharge: 2014-04-12 | Disposition: A | Discharge status: Home / Self Care | Payer: Medicare Other | Attending: Emergency Medicine | Admitting: Emergency Medicine

## 2014-04-12 ENCOUNTER — Encounter (HOSPITAL_COMMUNITY): Payer: Self-pay | Admitting: Emergency Medicine

## 2014-04-12 DIAGNOSIS — J4489 Other specified chronic obstructive pulmonary disease: Secondary | ICD-10-CM | POA: Insufficient documentation

## 2014-04-12 DIAGNOSIS — E079 Disorder of thyroid, unspecified: Secondary | ICD-10-CM | POA: Insufficient documentation

## 2014-04-12 DIAGNOSIS — I10 Essential (primary) hypertension: Secondary | ICD-10-CM | POA: Insufficient documentation

## 2014-04-12 DIAGNOSIS — J449 Chronic obstructive pulmonary disease, unspecified: Secondary | ICD-10-CM | POA: Insufficient documentation

## 2014-04-12 DIAGNOSIS — Z79899 Other long term (current) drug therapy: Secondary | ICD-10-CM | POA: Insufficient documentation

## 2014-04-12 DIAGNOSIS — F17200 Nicotine dependence, unspecified, uncomplicated: Secondary | ICD-10-CM | POA: Insufficient documentation

## 2014-04-12 DIAGNOSIS — M109 Gout, unspecified: Secondary | ICD-10-CM

## 2014-04-12 MED ORDER — HYDROCODONE-ACETAMINOPHEN 5-325 MG PO TABS: ORAL_TABLET | ORAL | Status: DC

## 2014-04-12 MED ORDER — PREDNISONE (PAK) 10 MG PO TABS: ORAL_TABLET | Freq: Every day | ORAL | Status: DC

## 2014-04-12 MED ORDER — PREDNISONE 50 MG PO TABS
60.0000 mg | ORAL_TABLET | Freq: Once | ORAL | Status: AC
  Administered 2014-04-12: 14:00:00 60 mg via ORAL
  Filled 2014-04-12 (×2): qty 1

## 2014-04-12 MED ORDER — HYDROCODONE-ACETAMINOPHEN 5-325 MG PO TABS
2.0000 | ORAL_TABLET | Freq: Once | ORAL | Status: DC
  Filled 2014-04-12: qty 2

## 2014-04-12 NOTE — Discharge Instructions (Signed)
°Emergency Department Resource Guide °1) Find a Doctor and Pay Out of Pocket °Although you won't have to find out who is covered by your insurance plan, it is a good idea to ask around and get recommendations. You will then need to call the office and see if the doctor you have chosen will accept you as a new patient and what types of options they offer for patients who are self-pay. Some doctors offer discounts or will set up payment plans for their patients who do not have insurance, but you will need to ask so you aren't surprised when you get to your appointment. ° °2) Contact Your Local Health Department °Not all health departments have doctors that can see patients for sick visits, but many do, so it is worth a call to see if yours does. If you don't know where your local health department is, you can check in your phone book. The CDC also has a tool to help you locate your state's health department, and many state websites also have listings of all of their local health departments. ° °3) Find a Walk-in Clinic °If your illness is not likely to be very severe or complicated, you may want to try a walk in clinic. These are popping up all over the country in pharmacies, drugstores, and shopping centers. They're usually staffed by nurse practitioners or physician assistants that have been trained to treat common illnesses and complaints. They're usually fairly quick and inexpensive. However, if you have serious medical issues or chronic medical problems, these are probably not your best option. ° °No Primary Care Doctor: °- Call [facility] at  832-8000 - they can help you locate a primary care doctor that  accepts your insurance, provides certain services, etc. °- Physician Referral Service- 1-800-533-3463 ° °Chronic Pain Problems: °Organization         Address  Phone   Notes  °[facility]  (336) 297-2271 Patients need to be referred by their primary care doctor.  ° °Medication  Assistance: °Organization         Address  Phone   Notes  °[place] [facility] [address] °[place], [place] [address] (336) 641-8030 --Must be a resident of [place] °-- Must have NO insurance coverage whatsoever (no Medicaid/ Medicare, etc.) °-- The pt. MUST have a primary care doctor that directs their care regularly and follows them in the community °  °[facility]  (866) 331-1348   °[employer]  (888) 892-1162   ° °Agencies that provide inexpensive medical care: °Organization         Address  Phone   Notes  °[facility]  (336) 832-8035   ° Internal Medicine    (336) 832-7272   °[facility] [address] °[place], [place] [address] (336) 832-4777   °[facility] [address], °[place] (336) 271-4999   °[facility]    (336) 373-0678   °[facility]    (336) 272-1050   °[facility] ° [address], [place] Phone:  (336) 832-4444, Fax:  (336) 832-4440 Hours of Operation:  9 am - 6 pm, M-F.  Also accepts Medicaid/Medicare and self-pay.  °[facility] ° [address], [place] Phone: (336) 832-3150, Fax: (336) [phone]. Hours of Operation:  8:30 am - 5:30 pm, M-F.  Also accepts Medicaid and self-pay.  °[facility] [place] [address], [place] Phone: (336) 878-6027   °[facility] [address], [place], [place] (336)723-1848, Ext. 123 Mondays & Thursdays: 7-9 AM.  First 15 patients are seen on a first come, first serve basis. °  ° °Medicaid-accepting [place] Providers: ° °Organization         Address  Phone   Notes  °[facility] [address], [place] (336) 641-2100 Also accepts self-pay patients.  °[facility] [address], [place] ° (336) 856-9996   °[facility] [address], [place]  (336) 288-8857   °[facility] [address] [place] [address], [place] (336) 299-7000   °Veita Bland [address], [place]  ° (336) 373-1557 Only accepts [place] Access Medicaid patients after they have their name applied to their card.  ° °Self-Pay (no insurance) in [place]: ° °Organization         Address  Phone   Notes  °Sickle Cell Patients, [facility] [address], [place] (336) 832-1970   °[facility] [address], [place] (336) 832-4400   °[facility] ° [address], [place] (336) 992-4800   °[facility]/Dr. Osei-Bonsu ° [address] [place] [address], [place] or [address], [place] (336) 841-8500 Phone number for both [place] and [place] locations is the same.  °[facility] [address], [place] (336) 299-0000   °[facility] [address] [place] [address], [place] or [address] (336) 852-7530 °(336) 878-2260   °[facility] [address], [place] (336) 350-1642, phone; (336) 294-5005, fax Sees patients 1st and 3rd Saturday of every month.  Must not qualify for public or private insurance (i.e. Medicaid, Medicare, [place] Health Choice, Veterans' Benefits) • Household income should be no more than 200% of the poverty level •The clinic cannot treat you if you are pregnant or think you are pregnant • Sexually transmitted diseases are not treated at the clinic.  ° ° °Dental Care: °Organization         Address  Phone  Notes  °[place] [facility] [address], [place] (336) 641-6152 Accepts children up to age 21 who are enrolled in Medicaid or [place] Health Choice; pregnant women with a Medicaid card; and children who have applied for Medicaid or [place] Health Choice, but were declined, whose parents can pay a reduced fee at time of service.  °[place]  [facility] [place]  [address], [place] (336) 641-7733 Accepts children up to age 21 who are enrolled in Medicaid or [place] Health Choice; pregnant women with a Medicaid card; and children who have applied for Medicaid or [place] Health Choice, but were declined, whose parents can pay a reduced fee at time of service.  °[facility] ° [address], [place] (336) 641-4533 Patients are seen by appointment only. Walk-ins are not accepted. [facility] will see patients 18 years of age and older. °Monday - Tuesday (8am-5pm) °Most Wednesdays (8:30-5pm) °$30 per visit, cash only  °[facility] ° [address], [place] (336) 641-4533 Patients are seen by appointment only. Walk-ins are not accepted. [facility] will see patients 18 years of age and older. °One   Wednesday Evening (Monthly: Volunteer Based).  $30 per visit, cash only  °[facility]  (919) 537-3737 for adults; Children under age 4, call Graduate Pediatric Dentistry at ([phone]) [phone]. Children aged 4-14, please call ([phone]) [phone] to request a pediatric application. ° Dental services are provided in all areas of dental care including fillings, crowns and bridges, complete and partial dentures, implants, gum treatment, root canals, and extractions. Preventive care is also provided. Treatment is provided to both adults and children. °Patients are selected via a lottery and there is often a waiting list. °  °[facility] [address], °[place] ° (336) 763-8833 [email or web address].drcivils.[email or web address] °  °[facility] [address], [place], [place] (336)723-1848, Ext. 123 Second and Fourth Thursday of each month, opens at 6:30 AM; Clinic ends at 9 AM.  Patients are seen on a first-come first-served basis, and a limited number are seen during each clinic.  ° °[facility] ° [address], [place], [place] (336) 723-7904    Eligibility Requirements °You must have lived in [place], [place], or [place] counties for at least the last three months. °  You cannot be eligible for state or federal sponsored healthcare insurance, including Veterans Administration, Medicaid, or Medicare. °  You generally cannot be eligible for healthcare insurance through your employer.  °  How to apply: °Eligibility screenings are held every Tuesday and Wednesday afternoon from 1:00 pm until 4:00 pm. You do not need an appointment for the interview!  °[facility] [address], [place], [place] 336-631-2330   °[facility]  336-342-8273   °[facility]  336-703-3100   °[facility]  336-570-6415   ° °Behavioral Health Resources in the Community: °Intensive Outpatient Programs °Organization         Address  Phone  Notes  °[facility] [address], [place], [place] 336-878-6098   °[facility] [address], [place], [place] 336-832-9800   °[facility] [address], [place], [place] ° 336-882-2125   °[facility] [address],  °[place], [place] 1-800-853-5163 or 336-641-4981   °Substance Abuse Resources °Organization         Address  Phone  Notes  °[facility]  336-882-2125   °[facility]  336-784-9470   °[facility]  336-285-9073   °[facility]  336-845-3988   °Residential & Outpatient Substance Abuse Program  1-800-659-3381   °Psychological Services °Organization         Address  Phone  Notes  °[facility]  336- 832-9600   °[facility]  336- 378-7881   °[facility] [address], [place] 1-800-853-5163 or 336-641-4981   ° °Mobile Crisis Teams °Organization         Address  Phone  Notes  °[facility], Mobile Crisis Care Unit  1-877-626-1772   °Assertive °[facility] ° [address]  [place], [place] 336-834-9664   °Sharon DeEsch [address] °[place] 336-554-5454   ° °Self-Help/Support Groups °Organization         Address  Phone             Notes  °[facility]  - variety of support groups  336- 373-1402 Call for more information  °Narcotics Anonymous (NA), [facility] [address], °[place]  2 meetings at this location  ° °  Residential Treatment Programs Organization         Address  Phone  Notes  [facility] [address]    [place]  1-501-426-6666   [facility]  [address] [place] [id number], [address] [place], [place]   [place], [place] [phone] Admissions: 8am-3pm M-F  [facility] [address]    [place], [place] 449-675-9163   [facility] [address] [place], [place], [place]   [facility] [address]  [place], [place]   [facility] Intensive Outpatient [address], [place], [place]   [facility]  [place], [place] 1-548-110-9895 or 709-274-3358   [facility] [address], [place], [place] Accepts Medicaid  [facility] [address],  [place] 1-478-033-7232 Substance Abuse/Addiction Treatment   [facility] Organization         Address  Phone  Notes  [facility]  254-526-9564   Domenic Schwab, PhD [address] [place] [place], [place]   336-310-0865 or 734-829-0269   [facility] [place], [place] 204-652-1130   [facility] [address], [place], [place] [phone] Insurance/Medicaid/sponsorship through [facility] and Families [address]                                    [place], [place] [phone] [place] [address] [place] [address], [place] [phone]    Dr. Adele Schilder  ([phone]) [phone]   [facility] Dept. 1) [address] [place] 2) [place] 3)  [place] [address], [place] ([phone]  [phone]   [place] (548) [phone] or [phone] (After Hours)       Take the prescriptions as directed.  Apply moist heat or ice to the area(s) of discomfort, for 15 minutes at a time, several times per day for the next few days.  Do not fall asleep on a heating or ice pack.  Call your regular medical doctor today to schedule a follow up appointment this week.  Return to the Emergency Department immediately if worsening.

## 2014-04-12 NOTE — ED Notes (Signed)
Pain great toe, hx  Of gout.  Dr Thurnell Garbe in to see pt

## 2014-04-12 NOTE — ED Provider Notes (Signed)
CSN: [id number]     Arrival date & time 04/12/14  1336 History   First MD Initiated Contact with Patient 04/12/14 1409     Chief Complaint  Patient presents with  . Gout     HPI Pt was seen at 1410. Per pt, c/o gradual onset and persistence of constant acute flair of her chronic right great toe "pain" since this morning. Pt describes the pain as "my usual gout pain." Denies injury, no fevers, no open wounds, no focal motor weakness, no tingling/numbness in extremity. The symptoms have been associated with no other complaints. The patient has a significant history of similar symptoms previously, recently being evaluated for this complaint and multiple prior evals for same.      Past Medical History  Diagnosis Date  . Gout   . Hypertension   . Thyroid disease   . COPD (chronic obstructive pulmonary disease)    Past Surgical History  Procedure Laterality Date  . Cholecystectomy    . Abdominal hysterectomy    . Shoulder surgery      History  Substance Use Topics  . Smoking status: Current Every Day Smoker -- 1.00 packs/day    Types: Cigarettes  . Smokeless tobacco: Not on file  . Alcohol Use: No    Review of Systems ROS: Statement: All systems negative except as marked or noted in the HPI; Constitutional: Negative for fever and chills. ; ; Eyes: Negative for eye pain, redness and discharge. ; ; ENMT: Negative for ear pain, hoarseness, nasal congestion, sinus pressure and sore throat. ; ; Cardiovascular: Negative for chest pain, palpitations, diaphoresis, dyspnea and peripheral edema. ; ; Respiratory: Negative for cough, wheezing and stridor. ; ; Gastrointestinal: Negative for nausea, vomiting, diarrhea, abdominal pain, blood in stool, hematemesis, jaundice and rectal bleeding. . ; ; Genitourinary: Negative for dysuria, flank pain and hematuria. ; ; Musculoskeletal: +right great toe pain. Negative for back pain and neck pain. Negative for deformity and trauma.; ; Skin: Negative for  pruritus, rash, abrasions, blisters, bruising and skin lesion.; ; Neuro: Negative for headache, lightheadedness and neck stiffness. Negative for weakness, altered level of consciousness , altered mental status, extremity weakness, paresthesias, involuntary movement, seizure and syncope.      Allergies  Tetanus toxoids; Morphine and related; Nsaids; Toradol; and Tramadol  Home Medications   Prior to Admission medications   Medication Sig Start Date End Date Taking? Authorizing Provider  acetaminophen-codeine (TYLENOL #3) 300-30 MG per tablet Take 1-2 tablets by mouth every 6 (six) hours as needed for moderate pain. 03/23/14   Lenox Ahr, PA-C  albuterol (VENTOLIN HFA) 108 (90 BASE) MCG/ACT inhaler Inhale 2 puffs into the lungs every 6 (six) hours as needed for shortness of breath.     Historical Provider, MD  amLODipine (NORVASC) 2.5 MG tablet Take 2.5 mg by mouth at bedtime.     Historical Provider, MD  HYDROcodone-acetaminophen (NORCO/VICODIN) 5-325 MG per tablet Take 1-2 tablets by mouth every 6 (six) hours as needed. pain    Historical Provider, MD  HYDROcodone-acetaminophen (NORCO/VICODIN) 5-325 MG per tablet 1 tab PO q6 hours prn pain 04/12/14   Alfonzo Feller, DO  levothyroxine (SYNTHROID, LEVOTHROID) 150 MCG tablet Take 150 mcg by mouth daily before breakfast.    Historical Provider, MD  LORazepam (ATIVAN) 1 MG tablet Take 1 tablet (1 mg total) by mouth every 6 (six) hours as needed for anxiety (rib pain/spasm). 03/23/14   Lenox Ahr, PA-C  mirtazapine (REMERON) 30 MG tablet Take  1 tablet by mouth at bedtime. 02/28/14   Historical Provider, MD  ondansetron (ZOFRAN ODT) 4 MG disintegrating tablet Take 1 tablet (4 mg total) by mouth every 6 (six) hours as needed for nausea or vomiting. 03/23/14   Lenox Ahr, PA-C  predniSONE (STERAPRED UNI-PAK) 10 MG tablet Take by mouth daily. Take 6 tabs PO day 1, then 5 tabs PO day 2, then 4 tabs PO day 3, then 3 tabs PO day 4, then 2 tabs PO  day 5, then 1 tab PO day 6. Start 04/13/2014. 04/12/14   Alfonzo Feller, DO   BP 128/72  Pulse 98  Temp(Src) 98.2 F (36.8 C) (Oral)  Resp 18  Ht 5\' 2"  (1.575 m)  Wt 111 lb (50.349 kg)  BMI 20.30 kg/m2  SpO2 97% Physical Exam 1415: Physical examination:  Nursing notes reviewed; Vital signs and O2 SAT reviewed;  Constitutional: Well developed, Well nourished, Well hydrated, In no acute distress; Head:  Normocephalic, atraumatic; Eyes: EOMI, PERRL, No scleral icterus; ENMT: Mouth and pharynx normal, Mucous membranes moist; Neck: Supple, Full range of motion, No lymphadenopathy; Cardiovascular: Regular rate and rhythm, No murmur, rub, or gallop; Respiratory: Breath sounds clear & equal bilaterally, No rales, rhonchi, wheezes.  Speaking full sentences with ease, Normal respiratory effort/excursion; Chest: Nontender, Movement normal; Abdomen: Soft, Nontender, Nondistended, Normal bowel sounds; Genitourinary: No CVA tenderness; Extremities: Pulses normal, +right MTP and great toe tenderness to palp with mild localized edema and erythema. No ecchymosis, no deformity. No open wounds. No calf edema or asymmetry.; Neuro: AA&Ox3, Major CN grossly intact.  Speech clear. No gross focal motor or sensory deficits in extremities.; Skin: Color normal, Warm, Dry.    ED Course  Procedures     EKG Interpretation None      MDM  MDM Reviewed: previous chart, nursing note and vitals    1430:  Pt is driving herself: will only give PO prednisone now, hold narcotic meds. Pt verb understanding. Pt wants to go home now. Dx d/w pt.  Questions answered.  Verb understanding, agreeable to d/c home with outpt f/u.   Alfonzo Feller, DO 04/13/14 1935

## 2014-04-12 NOTE — ED Notes (Signed)
Pt c/o right mid toe pain since this morning, states it's gout and has hx of gout to same toe a year ago

## 2014-04-20 ENCOUNTER — Emergency Department (HOSPITAL_COMMUNITY): Payer: Medicare Other

## 2014-04-20 ENCOUNTER — Emergency Department (HOSPITAL_COMMUNITY)
Admission: EM | Admit: 2014-04-20 | Discharge: 2014-04-20 | Disposition: A | Discharge status: Home / Self Care | Payer: Medicare Other | Attending: Emergency Medicine | Admitting: Emergency Medicine

## 2014-04-20 ENCOUNTER — Encounter (HOSPITAL_COMMUNITY): Payer: Self-pay | Admitting: Emergency Medicine

## 2014-04-20 DIAGNOSIS — I10 Essential (primary) hypertension: Secondary | ICD-10-CM | POA: Insufficient documentation

## 2014-04-20 DIAGNOSIS — F17200 Nicotine dependence, unspecified, uncomplicated: Secondary | ICD-10-CM | POA: Insufficient documentation

## 2014-04-20 DIAGNOSIS — J4489 Other specified chronic obstructive pulmonary disease: Secondary | ICD-10-CM | POA: Insufficient documentation

## 2014-04-20 DIAGNOSIS — E079 Disorder of thyroid, unspecified: Secondary | ICD-10-CM | POA: Insufficient documentation

## 2014-04-20 DIAGNOSIS — Z79899 Other long term (current) drug therapy: Secondary | ICD-10-CM | POA: Insufficient documentation

## 2014-04-20 DIAGNOSIS — Z8639 Personal history of other endocrine, nutritional and metabolic disease: Secondary | ICD-10-CM | POA: Insufficient documentation

## 2014-04-20 DIAGNOSIS — M25571 Pain in right ankle and joints of right foot: Secondary | ICD-10-CM

## 2014-04-20 DIAGNOSIS — J449 Chronic obstructive pulmonary disease, unspecified: Secondary | ICD-10-CM | POA: Insufficient documentation

## 2014-04-20 DIAGNOSIS — Z862 Personal history of diseases of the blood and blood-forming organs and certain disorders involving the immune mechanism: Secondary | ICD-10-CM | POA: Insufficient documentation

## 2014-04-20 DIAGNOSIS — M25579 Pain in unspecified ankle and joints of unspecified foot: Secondary | ICD-10-CM | POA: Insufficient documentation

## 2014-04-20 MED ORDER — HYDROCODONE-ACETAMINOPHEN 5-325 MG PO TABS
1.0000 | ORAL_TABLET | Freq: Once | ORAL | Status: AC
  Administered 2014-04-20: 1 via ORAL
  Filled 2014-04-20: qty 1

## 2014-04-20 MED ORDER — PREDNISONE 10 MG PO TABS: ORAL_TABLET | ORAL | Status: DC

## 2014-04-20 MED ORDER — PREDNISONE 50 MG PO TABS
60.0000 mg | ORAL_TABLET | Freq: Once | ORAL | Status: AC
  Administered 2014-04-20: 60 mg via ORAL
  Filled 2014-04-20 (×2): qty 1

## 2014-04-20 MED ORDER — HYDROCODONE-ACETAMINOPHEN 5-325 MG PO TABS: 1.0000 | ORAL_TABLET | ORAL | Status: DC | PRN

## 2014-04-20 NOTE — Discharge Instructions (Signed)
Ankle Pain Ankle pain is a common symptom. The bones, cartilage, tendons, and muscles of the ankle joint perform a lot of work each day. The ankle joint holds your body weight and allows you to move around. Ankle pain can occur on either side or back of 1 or both ankles. Ankle pain may be sharp and burning or dull and aching. There may be tenderness, stiffness, redness, or warmth around the ankle. The pain occurs more often when a person walks or puts pressure on the ankle. CAUSES  There are many reasons ankle pain can develop. It is important to work with your caregiver to identify the cause since many conditions can impact the bones, cartilage, muscles, and tendons. Causes for ankle pain include:  Injury, including a break (fracture), sprain, or strain often due to a fall, sports, or a high-impact activity.  Swelling (inflammation) of a tendon (tendonitis).  Achilles tendon rupture.  Ankle instability after repeated sprains and strains.  Poor foot alignment.  Pressure on a nerve (tarsal tunnel syndrome).  Arthritis in the ankle or the lining of the ankle.  Crystal formation in the ankle (gout or pseudogout). DIAGNOSIS  A diagnosis is based on your medical history, your symptoms, results of your physical exam, and results of diagnostic tests. Diagnostic tests may include X-ray exams or a computerized magnetic scan (magnetic resonance imaging, MRI). TREATMENT  Treatment will depend on the cause of your ankle pain and may include:  Keeping pressure off the ankle and limiting activities.  Using crutches or other walking support (a cane or brace).  Using rest, ice, compression, and elevation.  Participating in physical therapy or home exercises.  Wearing shoe inserts or special shoes.  Losing weight.  Taking medications to reduce pain or swelling or receiving an injection.  Undergoing surgery. HOME CARE INSTRUCTIONS   Only take over-the-counter or prescription medicines for  pain, discomfort, or fever as directed by your caregiver.  Put ice on the injured area.  Put ice in a plastic bag.  Place a towel between your skin and the bag.  Leave the ice on for 15-20 minutes at a time, 03-04 times a day.  Keep your leg raised (elevated) when possible to lessen swelling.  Avoid activities that cause ankle pain.  Follow specific exercises as directed by your caregiver.  Record how often you have ankle pain, the location of the pain, and what it feels like. This information may be helpful to you and your caregiver.  Ask your caregiver about returning to work or sports and whether you should drive.  Follow up with your caregiver for further examination, therapy, or testing as directed. SEEK MEDICAL CARE IF:   Pain or swelling continues or worsens beyond 1 week.  You have an oral temperature above 102 F (38.9 C).  You are feeling unwell or have chills.  You are having an increasingly difficult time with walking.  You have loss of sensation or other new symptoms.  You have questions or concerns. MAKE SURE YOU:   Understand these instructions.  Will watch your condition.  Will get help right away if you are not doing well or get worse. Document Released: 05/29/2010 Document Revised: 03/02/2012 Document Reviewed: 05/29/2010 [facility] Patient Information 2014 [place].   I suspect this may be an early gout flair in your ankle.  Use the medicines prescribed for your symptoms.  Take your next dose of prednisone tomorrow.  Elevation and warm compresses may also be helpful.

## 2014-04-20 NOTE — ED Notes (Signed)
C/o pain to right ankle starting this morning.  Denies injury.

## 2014-04-20 NOTE — ED Provider Notes (Signed)
CSN: [id number]     Arrival date & time 04/20/14  1507 History   First MD Initiated Contact with Patient 04/20/14 1601     Chief Complaint  Patient presents with  . Ankle Pain     (Consider location/radiation/quality/duration/timing/severity/associated sxs/prior Treatment) HPI Comments: Priscilla Lindsey is a 58 y.o. Female presenting with right ankle pain which she woke with this morning.  She denies injury, stating she mostly watched tv and slept yesterday.  Her pain feels like her gout but has never had it in her ankle before. There is no radiation of pain which is constant, worse with weight bearing and with movement.  She has used elevation which helps with the pain.       The history is provided by the patient.    Past Medical History  Diagnosis Date  . Gout   . Hypertension   . Thyroid disease   . COPD (chronic obstructive pulmonary disease)    Past Surgical History  Procedure Laterality Date  . Cholecystectomy    . Abdominal hysterectomy    . Shoulder surgery     No family history on file. History  Substance Use Topics  . Smoking status: Current Every Day Smoker -- 1.00 packs/day    Types: Cigarettes  . Smokeless tobacco: Not on file  . Alcohol Use: No   OB History   Grav Para Term Preterm Abortions TAB SAB Ect Mult Living                 Review of Systems  Musculoskeletal: Positive for arthralgias. Negative for joint swelling.  Skin: Negative for wound.  Neurological: Negative for weakness and numbness.      Allergies  Tetanus toxoids; Morphine and related; Nsaids; Toradol; and Tramadol  Home Medications   Prior to Admission medications   Medication Sig Start Date End Date Taking? Authorizing Provider  albuterol (VENTOLIN HFA) 108 (90 BASE) MCG/ACT inhaler Inhale 2 puffs into the lungs every 6 (six) hours as needed for shortness of breath.    Yes Historical Provider, MD  amLODipine (NORVASC) 2.5 MG tablet Take 2.5 mg by mouth at bedtime.    Yes  Historical Provider, MD  levothyroxine (SYNTHROID, LEVOTHROID) 150 MCG tablet Take 150 mcg by mouth daily before breakfast.   Yes Historical Provider, MD  mirtazapine (REMERON) 30 MG tablet Take 1 tablet by mouth at bedtime. 02/28/14  Yes Historical Provider, MD  HYDROcodone-acetaminophen (NORCO/VICODIN) 5-325 MG per tablet Take 1 tablet by mouth every 4 (four) hours as needed for moderate pain. 04/20/14   Evalee Jefferson, PA-C  predniSONE (DELTASONE) 10 MG tablet 6, 5, 4, 3, 2 then 1 tablet by mouth daily for 6 days total. 04/20/14   Evalee Jefferson, PA-C  predniSONE (STERAPRED UNI-PAK) 10 MG tablet Take by mouth daily. Take 6 tabs PO day 1, then 5 tabs PO day 2, then 4 tabs PO day 3, then 3 tabs PO day 4, then 2 tabs PO day 5, then 1 tab PO day 6. Start 04/13/2014. 04/12/14   Alfonzo Feller, DO  SUBOXONE 8-2 MG FILM Place 1 Film under the tongue 3 (three) times daily. 03/30/14   Historical Provider, MD   BP 130/73  Pulse 98  Temp(Src) 98 F (36.7 C) (Oral)  Resp 14  Ht 5\' 2"  (1.575 m)  Wt 112 lb (50.803 kg)  BMI 20.48 kg/m2  SpO2 94% Physical Exam  Constitutional: She appears well-developed and well-nourished.  HENT:  Head: Atraumatic.  Neck: Normal range  of motion.  Cardiovascular:  Pulses equal bilaterally  Musculoskeletal: She exhibits tenderness.       Right ankle: She exhibits normal range of motion, no swelling, no ecchymosis, no deformity and normal pulse. Tenderness. Lateral malleolus and medial malleolus tenderness found. No proximal fibula tenderness found. Achilles tendon normal.  Neurological: She is alert. She has normal strength. She displays normal reflexes. No sensory deficit.  Skin: Skin is warm and dry.  Psychiatric: She has a normal mood and affect.    ED Course  Procedures (including critical care time) Labs Review Labs Reviewed - No data to display  Imaging Review Dg Ankle Complete Right  04/20/2014   CLINICAL DATA:  Right ankle pain for 1 day.  EXAM: RIGHT ANKLE -  COMPLETE 3+ VIEW  COMPARISON:  05/16/2011  FINDINGS: There is no evidence of acute fracture or dislocation. Bone mineralization appears within normal limits. No lytic or blastic osseous lesion is seen. Visualized joint spaces appear preserved. No soft tissue abnormality is seen.  IMPRESSION: Negative.   Electronically Signed   By: Logan Bores   On: 04/20/2014 15:33     EKG Interpretation None      MDM   Final diagnoses:  Ankle pain, right    Suspect possible early gouty flare, although no edema, erythema at this time.  She was prescribed prednisone,  Quantity of 6 hydrocodone.  She states she is not taking suboxone, this was prescribed by a psychiatrist is [place] which she no longer sees.  Advised she needs to f/u with pcp for further tx if sx persist.     Evalee Jefferson, PA-C 04/20/14 1750

## 2014-04-21 NOTE — ED Provider Notes (Signed)
Medical screening examination/treatment/procedure(s) were performed by non-physician practitioner and as supervising physician I was immediately available for consultation/collaboration.   EKG Interpretation None      Rolland Porter, MD, Abram Sander   Janice Norrie, MD 04/21/14 Laureen Abrahams

## 2014-06-09 ENCOUNTER — Emergency Department (HOSPITAL_COMMUNITY)
Admission: EM | Admit: 2014-06-09 | Discharge: 2014-06-09 | Disposition: A | Discharge status: Home / Self Care | Payer: Medicare Other | Attending: Emergency Medicine | Admitting: Emergency Medicine

## 2014-06-09 ENCOUNTER — Encounter (HOSPITAL_COMMUNITY): Payer: Self-pay | Admitting: Emergency Medicine

## 2014-06-09 DIAGNOSIS — Z79899 Other long term (current) drug therapy: Secondary | ICD-10-CM | POA: Diagnosis not present

## 2014-06-09 DIAGNOSIS — M109 Gout, unspecified: Secondary | ICD-10-CM

## 2014-06-09 DIAGNOSIS — IMO0002 Reserved for concepts with insufficient information to code with codable children: Secondary | ICD-10-CM | POA: Diagnosis not present

## 2014-06-09 DIAGNOSIS — I10 Essential (primary) hypertension: Secondary | ICD-10-CM | POA: Insufficient documentation

## 2014-06-09 DIAGNOSIS — E079 Disorder of thyroid, unspecified: Secondary | ICD-10-CM | POA: Insufficient documentation

## 2014-06-09 DIAGNOSIS — J4489 Other specified chronic obstructive pulmonary disease: Secondary | ICD-10-CM | POA: Insufficient documentation

## 2014-06-09 DIAGNOSIS — F17200 Nicotine dependence, unspecified, uncomplicated: Secondary | ICD-10-CM | POA: Insufficient documentation

## 2014-06-09 DIAGNOSIS — M79674 Pain in right toe(s): Secondary | ICD-10-CM

## 2014-06-09 DIAGNOSIS — M79609 Pain in unspecified limb: Secondary | ICD-10-CM | POA: Diagnosis present

## 2014-06-09 DIAGNOSIS — J449 Chronic obstructive pulmonary disease, unspecified: Secondary | ICD-10-CM | POA: Insufficient documentation

## 2014-06-09 MED ORDER — PREDNISONE 10 MG PO TABS: 20.0000 mg | ORAL_TABLET | Freq: Two times a day (BID) | ORAL | Status: DC

## 2014-06-09 MED ORDER — HYDROCODONE-ACETAMINOPHEN 5-325 MG PO TABS: 1.0000 | ORAL_TABLET | ORAL | Status: DC | PRN

## 2014-06-09 NOTE — ED Provider Notes (Signed)
  Medical screening examination/treatment/procedure(s) were performed by non-physician practitioner and as supervising physician I was immediately available for consultation/collaboration.   EKG Interpretation None         Carmin Muskrat, MD 06/09/14 1500

## 2014-06-09 NOTE — ED Notes (Signed)
Patient c/o right foot pain this morning. Denies any known injury. Reports hx of gout. Patient reports taking tylenol with no relief.

## 2014-06-09 NOTE — ED Notes (Signed)
Pt states she woke up this morning with her rt foot hurting. Thinks it may be due to her gout but she doesn't have any medications for it.

## 2014-06-09 NOTE — ED Provider Notes (Signed)
CSN: [id number]     Arrival date & time 06/09/14  1416 History   First MD Initiated Contact with Patient 06/09/14 1422     Chief Complaint  Patient presents with  . Foot Pain     (Consider location/radiation/quality/duration/timing/severity/associated sxs/prior Treatment) Patient is a 58 y.o. female presenting with lower extremity pain. The history is provided by the patient.  Foot Pain This is a new problem. The current episode started today. The problem occurs constantly. The problem has been gradually worsening.   Priscilla Lindsey is a 58 y.o. female who presents to the ED with pain in her right great toe that radiates to the dorsum of the foot. The pain increases with pressure or any movement of the toe. Pain increases with ambulation. The pain started this morning. She denies any other problems. She states she has had similar pain in the past that does well with prednisone. Her doctor has told her she has gout.   Past Medical History  Diagnosis Date  . Gout   . Hypertension   . Thyroid disease   . COPD (chronic obstructive pulmonary disease)    Past Surgical History  Procedure Laterality Date  . Cholecystectomy    . Abdominal hysterectomy    . Shoulder surgery     History reviewed. No pertinent family history. History  Substance Use Topics  . Smoking status: Current Every Day Smoker -- 1.00 packs/day for 41 years    Types: Cigarettes  . Smokeless tobacco: Never Used  . Alcohol Use: No   OB History   Grav Para Term Preterm Abortions TAB SAB Ect Mult Living   1 1 1       1      Review of Systems Negative except as stated in HPI   Allergies  Tetanus toxoids; Morphine and related; Nsaids; Toradol; and Tramadol  Home Medications   Prior to Admission medications   Medication Sig Start Date End Date Taking? Authorizing Provider  albuterol (VENTOLIN HFA) 108 (90 BASE) MCG/ACT inhaler Inhale 2 puffs into the lungs every 6 (six) hours as needed for shortness of breath.      Historical Provider, MD  amLODipine (NORVASC) 2.5 MG tablet Take 2.5 mg by mouth at bedtime.     Historical Provider, MD  HYDROcodone-acetaminophen (NORCO/VICODIN) 5-325 MG per tablet Take 1 tablet by mouth every 4 (four) hours as needed for moderate pain. 04/20/14   Evalee Jefferson, PA-C  levothyroxine (SYNTHROID, LEVOTHROID) 150 MCG tablet Take 150 mcg by mouth daily before breakfast.    Historical Provider, MD  mirtazapine (REMERON) 30 MG tablet Take 1 tablet by mouth at bedtime. 02/28/14   Historical Provider, MD  predniSONE (DELTASONE) 10 MG tablet 6, 5, 4, 3, 2 then 1 tablet by mouth daily for 6 days total. 04/20/14   Evalee Jefferson, PA-C  predniSONE (STERAPRED UNI-PAK) 10 MG tablet Take by mouth daily. Take 6 tabs PO day 1, then 5 tabs PO day 2, then 4 tabs PO day 3, then 3 tabs PO day 4, then 2 tabs PO day 5, then 1 tab PO day 6. Start 04/13/2014. 04/12/14   Alfonzo Feller, DO  SUBOXONE 8-2 MG FILM Place 1 Film under the tongue 3 (three) times daily. 03/30/14   Historical Provider, MD   BP 144/71  Pulse 93  Temp(Src) 97.8 F (36.6 C) (Oral)  Resp 18  Ht 5\' 2"  (1.575 m)  Wt 113 lb (51.256 kg)  BMI 20.66 kg/m2  SpO2 95% Physical Exam  Nursing note and vitals reviewed. Constitutional: She is oriented to person, place, and time. She appears well-developed and well-nourished. No distress.  HENT:  Head: Normocephalic.  Eyes: Conjunctivae and EOM are normal.  Neck: Neck supple.  Cardiovascular: Normal rate.   Pulmonary/Chest: Effort normal.  Musculoskeletal:       Right foot: She exhibits decreased range of motion (due to pain) and tenderness. She exhibits normal capillary refill, no crepitus, no deformity and no laceration. Swelling: minimal.       Feet:  Pain with palpation and range of motion of the right great toe. Pain radiates to the foot. Pedal pulses strong, adequate circulation, good touch sensation.   Neurological: She is alert and oriented to person, place, and time. No cranial nerve  deficit.  Skin: Skin is warm and dry.  Psychiatric: She has a normal mood and affect. Her behavior is normal.    ED Course  Procedures   MDM  58 y.o. female with right great toe pain that radiates to the dorsum of the right foot. Hx of gout, no known injury. Stable for discharge without neurovascular compromise. Will treat for pain and inflammation. Discussed clinical findings and plan of care with the patient and she voices understanding and agrees with plan.    Medication List    TAKE these medications       predniSONE 10 MG tablet  Commonly known as:  DELTASONE  Take 2 tablets (20 mg total) by mouth 2 (two) times daily with a meal.      ASK your doctor about these medications       amLODipine 2.5 MG tablet  Commonly known as:  NORVASC  Take 2.5 mg by mouth at bedtime.     HYDROcodone-acetaminophen 5-325 MG per tablet  Commonly known as:  NORCO/VICODIN  Take 1 tablet by mouth every 4 (four) hours as needed for moderate pain.  Ask about: Which instructions should I use?     HYDROcodone-acetaminophen 5-325 MG per tablet  Commonly known as:  NORCO/VICODIN  Take 1 tablet by mouth every 4 (four) hours as needed.  Ask about: Which instructions should I use?     levothyroxine 150 MCG tablet  Commonly known as:  SYNTHROID, LEVOTHROID  Take 150 mcg by mouth daily before breakfast.     mirtazapine 30 MG tablet  Commonly known as:  REMERON  Take 1 tablet by mouth at bedtime.     SUBOXONE 8-2 MG Film  Generic drug:  Buprenorphine HCl-Naloxone HCl  Place 1 Film under the tongue 3 (three) times daily.     VENTOLIN HFA 108 (90 BASE) MCG/ACT inhaler  Generic drug:  albuterol  Inhale 2 puffs into the lungs every 6 (six) hours as needed for shortness of breath.         [place], [place] 06/09/14 1447

## 2014-10-11 ENCOUNTER — Emergency Department (HOSPITAL_COMMUNITY)
Admission: EM | Admit: 2014-10-11 | Discharge: 2014-10-11 | Disposition: A | Discharge status: Home / Self Care | Payer: Medicare Other | Attending: Emergency Medicine | Admitting: Emergency Medicine

## 2014-10-11 ENCOUNTER — Encounter (HOSPITAL_COMMUNITY): Payer: Self-pay | Admitting: Emergency Medicine

## 2014-10-11 DIAGNOSIS — E079 Disorder of thyroid, unspecified: Secondary | ICD-10-CM | POA: Insufficient documentation

## 2014-10-11 DIAGNOSIS — M109 Gout, unspecified: Secondary | ICD-10-CM | POA: Insufficient documentation

## 2014-10-11 DIAGNOSIS — Z79899 Other long term (current) drug therapy: Secondary | ICD-10-CM | POA: Insufficient documentation

## 2014-10-11 DIAGNOSIS — M12871 Other specific arthropathies, not elsewhere classified, right ankle and foot: Secondary | ICD-10-CM | POA: Insufficient documentation

## 2014-10-11 DIAGNOSIS — I10 Essential (primary) hypertension: Secondary | ICD-10-CM | POA: Insufficient documentation

## 2014-10-11 DIAGNOSIS — Z720 Tobacco use: Secondary | ICD-10-CM | POA: Insufficient documentation

## 2014-10-11 DIAGNOSIS — J449 Chronic obstructive pulmonary disease, unspecified: Secondary | ICD-10-CM | POA: Diagnosis not present

## 2014-10-11 DIAGNOSIS — M10071 Idiopathic gout, right ankle and foot: Secondary | ICD-10-CM

## 2014-10-11 MED ORDER — PREDNISONE 50 MG PO TABS
60.0000 mg | ORAL_TABLET | Freq: Once | ORAL | Status: AC
  Administered 2014-10-11: 12:00:00 60 mg via ORAL
  Filled 2014-10-11 (×2): qty 1

## 2014-10-11 MED ORDER — PREDNISONE 10 MG PO TABS: ORAL_TABLET | ORAL | Status: AC

## 2014-10-11 MED ORDER — PREDNISONE 10 MG PO TABS
ORAL_TABLET | ORAL | Status: AC
  Filled 2014-10-11: qty 1

## 2014-10-11 MED ORDER — PREDNISONE 10 MG PO TABS: ORAL_TABLET | ORAL | Status: DC

## 2014-10-11 MED ORDER — HYDROCODONE-ACETAMINOPHEN 5-325 MG PO TABS: 1.0000 | ORAL_TABLET | ORAL | Status: DC | PRN

## 2014-10-11 NOTE — ED Provider Notes (Signed)
CSN: [id number]     Arrival date & time 10/11/14  1022 History   First MD Initiated Contact with Patient 10/11/14 1031     Chief Complaint  Patient presents with  . Gout     (Consider location/radiation/quality/duration/timing/severity/associated sxs/prior Treatment) Patient is a 58 y.o. female presenting with lower extremity pain. The history is provided by the patient. No language interpreter was used.  Foot Pain This is a recurrent problem. The current episode started yesterday. The problem has been unchanged. Pertinent negatives include no chills, fever or numbness. Associated symptoms comments: Right foot pain at the 1st joint c/w multiple previous episodes of gout. No fever. No injury..    Past Medical History  Diagnosis Date  . Gout   . Hypertension   . Thyroid disease   . COPD (chronic obstructive pulmonary disease)    Past Surgical History  Procedure Laterality Date  . Cholecystectomy    . Abdominal hysterectomy    . Shoulder surgery     No family history on file. History  Substance Use Topics  . Smoking status: Current Every Day Smoker -- 1.00 packs/day for 41 years    Types: Cigarettes  . Smokeless tobacco: Never Used  . Alcohol Use: No   OB History   Grav Para Term Preterm Abortions TAB SAB Ect Mult Living   1 1 1       1      Review of Systems  Constitutional: Negative for fever and chills.  Respiratory: Negative.   Cardiovascular: Negative.   Gastrointestinal: Negative.   Musculoskeletal:       See HPI.  Skin: Negative.  Negative for color change.  Neurological: Negative.  Negative for numbness.      Allergies  Tetanus toxoids; Morphine and related; Nsaids; Toradol; and Tramadol  Home Medications   Prior to Admission medications   Medication Sig Start Date End Date Taking? Authorizing Provider  albuterol (VENTOLIN HFA) 108 (90 BASE) MCG/ACT inhaler Inhale 2 puffs into the lungs every 6 (six) hours as needed for shortness of breath.    Yes  Historical Provider, MD  amLODipine (NORVASC) 2.5 MG tablet Take 2.5 mg by mouth at bedtime.    Yes Historical Provider, MD  levothyroxine (SYNTHROID, LEVOTHROID) 150 MCG tablet Take 150 mcg by mouth daily.    Yes Historical Provider, MD  mirtazapine (REMERON) 30 MG tablet Take 1 tablet by mouth at bedtime. 02/28/14  Yes Historical Provider, MD  SUBOXONE 8-2 MG FILM Place 1 Film under the tongue 3 (three) times daily. 03/30/14  Yes Historical Provider, MD   BP 119/76  Pulse 88  Temp(Src) 97.9 F (36.6 C)  Resp 18  Ht 5\' 2"  (1.575 m)  Wt 118 lb (53.524 kg)  BMI 21.58 kg/m2  SpO2 94% Physical Exam  Constitutional: She is oriented to person, place, and time. She appears well-developed and well-nourished.  Neck: Normal range of motion.  Pulmonary/Chest: Effort normal.  Musculoskeletal:  Right foot tender over 1st MTP joint with redness. Minimal swelling.   Neurological: She is alert and oriented to person, place, and time.  Skin: Skin is warm and dry.    ED Course  Procedures (including critical care time) Labs Review Labs Reviewed - No data to display  Imaging Review No results found.   EKG Interpretation None      MDM   Final diagnoses:  None    1. Gouty arthritis  Findings c/w uncomplicated, recurrent gout. She reports good results in the past with prednisone  taper. Will treat with #5 norco for pain and encourage PCP follow up.    Dewaine Oats, PA-C 10/11/14 1128

## 2014-10-11 NOTE — Discharge Instructions (Signed)

## 2014-10-11 NOTE — ED Provider Notes (Signed)
Medical screening examination/treatment/procedure(s) were performed by non-physician practitioner and as supervising physician I was immediately available for consultation/collaboration.   EKG Interpretation None        Sharyon Cable, MD 10/11/14 1133

## 2014-10-11 NOTE — ED Notes (Signed)
PT c/o right great toe swelling/redness/pain since 0500 this am and has a reported hx of gout.

## 2014-10-11 NOTE — ED Notes (Signed)
Pt c/o gout pain to right foot since 0500 this am.

## 2014-10-24 ENCOUNTER — Encounter (HOSPITAL_COMMUNITY): Payer: Self-pay | Admitting: Emergency Medicine

## 2015-11-16 ENCOUNTER — Encounter (HOSPITAL_COMMUNITY): Payer: Self-pay

## 2015-11-16 ENCOUNTER — Emergency Department (HOSPITAL_COMMUNITY)
Admission: EM | Admit: 2015-11-16 | Discharge: 2015-11-16 | Disposition: A | Discharge status: Home / Self Care | Payer: Medicare Other | Attending: Emergency Medicine | Admitting: Emergency Medicine

## 2015-11-16 ENCOUNTER — Emergency Department (HOSPITAL_COMMUNITY): Payer: Medicare Other

## 2015-11-16 DIAGNOSIS — Z79899 Other long term (current) drug therapy: Secondary | ICD-10-CM | POA: Insufficient documentation

## 2015-11-16 DIAGNOSIS — M109 Gout, unspecified: Secondary | ICD-10-CM | POA: Insufficient documentation

## 2015-11-16 DIAGNOSIS — F17210 Nicotine dependence, cigarettes, uncomplicated: Secondary | ICD-10-CM | POA: Insufficient documentation

## 2015-11-16 DIAGNOSIS — I10 Essential (primary) hypertension: Secondary | ICD-10-CM | POA: Insufficient documentation

## 2015-11-16 DIAGNOSIS — M542 Cervicalgia: Secondary | ICD-10-CM

## 2015-11-16 DIAGNOSIS — Y998 Other external cause status: Secondary | ICD-10-CM | POA: Insufficient documentation

## 2015-11-16 DIAGNOSIS — S199XXA Unspecified injury of neck, initial encounter: Secondary | ICD-10-CM | POA: Insufficient documentation

## 2015-11-16 DIAGNOSIS — J449 Chronic obstructive pulmonary disease, unspecified: Secondary | ICD-10-CM | POA: Diagnosis not present

## 2015-11-16 DIAGNOSIS — Y92410 Unspecified street and highway as the place of occurrence of the external cause: Secondary | ICD-10-CM | POA: Diagnosis not present

## 2015-11-16 DIAGNOSIS — E039 Hypothyroidism, unspecified: Secondary | ICD-10-CM | POA: Diagnosis not present

## 2015-11-16 DIAGNOSIS — Y9389 Activity, other specified: Secondary | ICD-10-CM | POA: Diagnosis not present

## 2015-11-16 MED ORDER — OXYCODONE-ACETAMINOPHEN 5-325 MG PO TABS
1.0000 | ORAL_TABLET | Freq: Once | ORAL | Status: AC
  Administered 2015-11-16: 1 via ORAL
  Filled 2015-11-16: qty 1

## 2015-11-16 MED ORDER — METAXALONE 800 MG PO TABS: 800.0000 mg | ORAL_TABLET | Freq: Three times a day (TID) | ORAL | Status: AC | PRN
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
## 2015-11-16 NOTE — Discharge Instructions (Signed)
°Emergency Department Resource Guide °1) Find a Doctor and Pay Out of Pocket °Although you won't have to find out who is covered by your insurance plan, it is a good idea to ask around and get recommendations. You will then need to call the office and see if the doctor you have chosen will accept you as a new patient and what types of options they offer for patients who are self-pay. Some doctors offer discounts or will set up payment plans for their patients who do not have insurance, but you will need to ask so you aren't surprised when you get to your appointment. ° °2) Contact Your Local Health Department °Not all health departments have doctors that can see patients for sick visits, but many do, so it is worth a call to see if yours does. If you don't know where your local health department is, you can check in your phone book. The CDC also has a tool to help you locate your state's health department, and many state websites also have listings of all of their local health departments. ° °3) Find a Walk-in Clinic °If your illness is not likely to be very severe or complicated, you may want to try a walk in clinic. These are popping up all over the country in pharmacies, drugstores, and shopping centers. They're usually staffed by nurse practitioners or physician assistants that have been trained to treat common illnesses and complaints. They're usually fairly quick and inexpensive. However, if you have serious medical issues or chronic medical problems, these are probably not your best option. ° °No Primary Care Doctor: °- Call [facility] at  832-8000 - they can help you locate a primary care doctor that  accepts your insurance, provides certain services, etc. °- Physician Referral Service- 1-800-533-3463 ° °Chronic Pain Problems: °Organization         Address  Phone   Notes  °[facility]  (336) 297-2271 Patients need to be referred by their primary care doctor.  ° °Medication  Assistance: °Organization         Address  Phone   Notes  °[place] [facility] [address] °[place], [place] [address] (336) 641-8030 --Must be a resident of [place] °-- Must have NO insurance coverage whatsoever (no Medicaid/ Medicare, etc.) °-- The pt. MUST have a primary care doctor that directs their care regularly and follows them in the community °  °[facility]  (866) 331-1348   °[employer]  (888) 892-1162   ° °Agencies that provide inexpensive medical care: °Organization         Address  Phone   Notes  °[facility]  (336) 832-8035   °[facility]    (336) 832-7272   °[facility] [address] °[place], [place] [address] (336) 832-4777   °[facility] [address], °[place] (336) 271-4999   °[facility]    (336) 373-0678   °[facility]    (336) 272-1050   °[facility] ° [address], [place] Phone:  (336) 832-4444, Fax:  (336) 832-4440 Hours of Operation:  9 am - 6 pm, M-F.  Also accepts Medicaid/Medicare and self-pay.  °[facility] ° [address], [place] Phone: (336) [phone], Fax: (336) [phone]. Hours of Operation:  8:30 am - 5:30 pm, M-F.  Also accepts Medicaid and self-pay.  °[facility] [place] [address], [place] Phone: (336) 878-6027   °[facility] [address], [place], [place] (336)723-1848, Ext. 123 Mondays & Thursdays: 7-9 AM.  First 15 patients are seen on a first come, first serve basis. °  ° °Medicaid-accepting [place] Providers: ° °Organization         Address  Phone   Notes  °[facility] [address], [place] (336) [phone] Also accepts self-pay patients.  °[facility] [address], [place] ° (336) 856-9996   °[facility] [address], [place]  (336) 288-8857   °[facility] [address] [place] [address], [place] (336) 299-7000   °Veita Bland [address], [place]  ° (336) 373-1557 Only accepts [place] Access Medicaid patients after they have their name applied to their card.  ° °Self-Pay (no insurance) in [place]: ° °Organization         Address  Phone   Notes  °Sickle Cell Patients, [facility] [address], [place] (336) 832-1970   °[facility] [address], [place] (336) 832-4400   °[facility] ° [address], [place] (336) 992-4800   °[facility]/Dr. Osei-Bonsu ° [address] [place] [address], [place] or [address], [place] (336) [phone] Phone number for both [place] and [place] locations is the same.  °[facility] [address], [place] (336) 299-0000   °[facility] [address] [place] [address], [place] or [address] (336) 852-7530 °(336) 878-2260   °[facility] [address], [place] (336) 350-1642, phone; (336) 294-5005, fax Sees patients 1st and 3rd Saturday of every month.  Must not qualify for public or private insurance (i.e. Medicaid, Medicare, [place] Health Choice, Veterans' Benefits) • Household income should be no more than 200% of the poverty level •The clinic cannot treat you if you are pregnant or think you are pregnant • Sexually transmitted diseases are not treated at the clinic.  ° ° °Dental Care: °Organization         Address  Phone  Notes  °[place] [facility] [address], [place] (336) 641-6152 Accepts children up to age 21 who are enrolled in Medicaid or [place] Health Choice; pregnant women with a Medicaid card; and children who have applied for Medicaid or [place] Health Choice, but were declined, whose parents can pay a reduced fee at time of service.  °[place]  [facility] [place]  [address], [place] (336) 641-7733 Accepts children up to age 21 who are enrolled in Medicaid or [place] Health Choice; pregnant women with a Medicaid card; and children who have applied for Medicaid or [place] Health Choice, but were declined, whose parents can pay a reduced fee at time of service.  °[facility] ° [address],  (336) 641-4533 Patients are seen by appointment only. Walk-ins are not accepted. [facility] will see patients 18 years of age and older. °Monday - Tuesday (8am-5pm) °Most Wednesdays (8:30-5pm) °$30 per visit, cash only  °[facility] ° [address], [place] (336) [phone] Patients are seen by appointment only. Walk-ins are not accepted. [facility] will see patients 18 years of age and older. °One   Wednesday Evening (Monthly: Volunteer Based).  $30 per visit, cash only  °[facility]  ([phone]) [phone] for adults; Children under age 4, call Graduate Pediatric Dentistry at ([phone]) [phone]. Children aged 4-14, please call ([phone]) [phone] to request a pediatric application. ° Dental services are provided in all areas of dental care including fillings, crowns and bridges, complete and partial dentures, implants, gum treatment, root canals, and extractions. Preventive care is also provided. Treatment is provided to both adults and children. °Patients are selected via a lottery and there is often a waiting list. °  °[facility] [address], °[place] ° (336) 763-8833 [email or web address] °  °[facility] [address], [place], [place] (336)723-1848, Ext. 123 Second and Fourth Thursday of each month, opens at 6:30 AM; Clinic ends at 9 AM.  Patients are seen on a first-come first-served basis, and a limited number are seen during each clinic.  ° °[facility] ° [address], [place], [place] (336) [phone]    Eligibility Requirements °You must have lived in [place], or [place] counties for at least the last three months. °  You cannot be eligible for state or federal sponsored healthcare insurance, including Veterans Administration, Medicaid, or Medicare. °  You generally cannot be eligible for healthcare insurance through your employer.  °  How to apply: °Eligibility screenings are held every Tuesday and Wednesday afternoon from 1:00 pm until 4:00 pm. You do not need an appointment for the interview!  °[facility] [address], [place], [place] 336-631-2330   °[facility]  336-342-8273   °[facility]  336-703-3100   °[facility]  336-570-6415   ° °Behavioral Health Resources in the Community: °Intensive Outpatient Programs °Organization         Address  Phone  Notes  °[facility] [address], [place], [place] 336-878-6098   °[facility] [address], [place], [place] 336-832-9800   °[facility] [address], [place], [place] ° 336-882-2125   °[facility] [address],  °[place], [place] 1-800-853-5163 or 336-641-4981   °Substance Abuse Resources °Organization         Address  Phone  Notes  °[facility]  336-882-2125   °[facility]  336-784-9470   °[facility]  336-285-9073   °[facility]  336-845-3988   °Residential & Outpatient Substance Abuse Program  1-800-659-3381   °Psychological Services °Organization         Address  Phone  Notes  °[facility]  336- 832-9600   °[facility]  336- 378-7881   °[facility] [address], [place] 1-800-853-5163 or 336-641-4981   ° °Mobile Crisis Teams °Organization         Address  Phone  Notes  °[facility], Mobile Crisis Care Unit  1-877-626-1772   °Assertive °[facility] ° [address]  [place], [place] 336-834-9664   °Sharon DeEsch [address] °[place] 336-554-5454   ° °Self-Help/Support Groups °Organization         Address  Phone             Notes  °[facility] - variety of support groups  336- 373-1402 Call for more information  °Narcotics Anonymous (NA), [facility] [address], °[place]  2 meetings at this location  ° °  Residential Treatment Programs Organization         Address  Phone  Notes  [facility] [address],    [place]  1-918-185-4513   [facility]  [address] [place] [address], [place], [place]   [facility] [place], [place] [phone] Admissions: 8am-3pm M-F  [facility] [address]    [place], [place] [phone]   [facility] [address] [place], [place], [place]   [facility] [address]  [place], [place]   [facility] Intensive Outpatient [address], [place], [place]   [facility]  [place], [place] 1-250-816-8953 or [phone]   [facility] [address], [place], [place] Accepts Medicaid  [facility] [address],  [place] 1-(403)056-6524 Substance Abuse/Addiction Treatment   [facility]         Address  Phone  Notes  [facility]  [phone]   Domenic Schwab, PhD [address] [place], [place]   212-684-3592 or 602-233-0305   [facility] [place], [place] [phone]   [facility] [address], [place], [place] [phone] Insurance/Medicaid/sponsorship through [facility] Families [address]                                    [place], [place] [phone] [place] [address], [place] [phone]    Dr. Adele Schilder  [phone]   [facility] Dept. 1) [address], [place] 2) [place] 3)  [address], [place] [phone]  [phone]   [place] ([phone] or [phone] (After Hours)      Take the prescription as directed.  Apply moist heat or ice to the area(s) of discomfort, for 15 minutes at a time, several times per day for the next few days.  Do not fall asleep on a heating or ice pack.  Call your regular medical doctor on Monday to schedule a follow up appointment next week.  Return to the Emergency Department immediately if worsening.

## 2016-09-02 ENCOUNTER — Institutional Professional Consult (permissible substitution): Payer: Medicare Other | Admitting: Pulmonary Disease

## 2016-09-23 ENCOUNTER — Institutional Professional Consult (permissible substitution): Payer: Medicare Other | Admitting: Pulmonary Disease

## 2016-10-28 ENCOUNTER — Institutional Professional Consult (permissible substitution): Payer: Medicare Other | Admitting: Pulmonary Disease

## 2018-05-06 ENCOUNTER — Other Ambulatory Visit (HOSPITAL_COMMUNITY): Payer: Self-pay | Admitting: Family Medicine

## 2018-05-06 DIAGNOSIS — F17218 Nicotine dependence, cigarettes, with other nicotine-induced disorders: Secondary | ICD-10-CM

## 2018-05-22 ENCOUNTER — Ambulatory Visit (HOSPITAL_COMMUNITY): Payer: Medicare Other

## 2018-06-19 ENCOUNTER — Ambulatory Visit (HOSPITAL_COMMUNITY): Payer: Medicare Other

## 2018-06-19 ENCOUNTER — Encounter (HOSPITAL_COMMUNITY): Payer: Self-pay

## 2018-06-19 ENCOUNTER — Other Ambulatory Visit (HOSPITAL_COMMUNITY): Payer: Self-pay | Admitting: Family Medicine

## 2018-06-19 DIAGNOSIS — R918 Other nonspecific abnormal finding of lung field: Secondary | ICD-10-CM

## 2018-06-19 DIAGNOSIS — C3490 Malignant neoplasm of unspecified part of unspecified bronchus or lung: Secondary | ICD-10-CM

## 2018-06-29 ENCOUNTER — Ambulatory Visit (HOSPITAL_COMMUNITY)
Admission: RE | Admit: 2018-06-29 | Discharge: 2018-06-29 | Disposition: A | Discharge status: Home / Self Care | Payer: Medicare Other | Source: Ambulatory Visit | Attending: Family Medicine | Admitting: Family Medicine

## 2018-06-29 ENCOUNTER — Encounter (HOSPITAL_COMMUNITY): Payer: Self-pay

## 2018-06-29 DIAGNOSIS — C3490 Malignant neoplasm of unspecified part of unspecified bronchus or lung: Secondary | ICD-10-CM

## 2018-06-29 DIAGNOSIS — R918 Other nonspecific abnormal finding of lung field: Secondary | ICD-10-CM

## 2018-06-30 ENCOUNTER — Other Ambulatory Visit (HOSPITAL_COMMUNITY): Payer: Self-pay | Admitting: Family Medicine

## 2018-06-30 DIAGNOSIS — C3490 Malignant neoplasm of unspecified part of unspecified bronchus or lung: Secondary | ICD-10-CM

## 2018-06-30 DIAGNOSIS — R918 Other nonspecific abnormal finding of lung field: Secondary | ICD-10-CM

## 2018-07-06 ENCOUNTER — Ambulatory Visit (HOSPITAL_COMMUNITY): Payer: Medicare Other

## 2018-07-09 ENCOUNTER — Encounter (HOSPITAL_COMMUNITY)
Admission: RE | Admit: 2018-07-09 | Discharge: 2018-07-09 | Disposition: A | Discharge status: Home / Self Care | Payer: Medicare Other | Source: Ambulatory Visit | Attending: Diagnostic Radiology | Admitting: Diagnostic Radiology

## 2018-07-09 DIAGNOSIS — C3490 Malignant neoplasm of unspecified part of unspecified bronchus or lung: Secondary | ICD-10-CM | POA: Diagnosis present

## 2018-07-09 LAB — GLUCOSE, CAPILLARY: Glucose-Capillary: 124 mg/dL — ABNORMAL HIGH (ref 70–99)

## 2018-07-09 MED ORDER — FLUDEOXYGLUCOSE F - 18 (FDG) INJECTION: 6.4000 | Freq: Once | INTRAVENOUS | Status: DC | PRN

## 2018-08-12 IMAGING — CT NM PET TUM IMG INITIAL (PI) SKULL BASE T - THIGH
1 of 8 series · 1 of 25 positions shown · non-contrast
Comparison: Chest CT 06/18/2018

CLINICAL DATA: Initial treatment strategy for lung mass.

EXAM:
NUCLEAR MEDICINE PET SKULL BASE TO THIGH
TECHNIQUE: 6.4 mCi F-18 FDG was injected intravenously. Full-ring PET imaging
was performed from the skull base to thigh after the radiotracer. CT
data was obtained and used for attenuation correction and anatomic
localization.
Fasting blood glucose: 124 mg/dl

[Series 3: pet sk_thigh ac · axial · 5.0mm · 4.07mm/px · 1 of 192 slices shown]
[im 128/192]
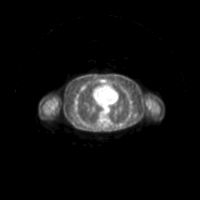

[1 of 25 positions shown; findings below may reference images not displayed]

FINDINGS: Mediastinal blood pool activity: SUV max

NECK: No hypermetabolic lymph nodes in the neck.

Incidental CT findings: none

CHEST: Hypermetabolic peripheral mass in the LEFT lower lobe is
triangular in shape measuring 3.3 x 3.6 cm with SUV max equal 15.1.
Several small hypermetabolic pleural-based nodules are present in
the more inferior LEFT lower lobe.

Hypermetabolic LEFT hilar lymph node with SUV max equal
hypermetabolic. Contralateral RIGHT lower paratracheal lymph node
with SUV max equal 6.0.

Within the RIGHT upper lobe, hypermetabolic nodule measures 11 mm
with SUV max equal 4.3.

Incidental CT findings: Coronary artery calcification and aortic
atherosclerotic calcification. Mild centrilobular emphysema in the
upper lobes.

ABDOMEN/PELVIS: Hypermetabolic lesion in the RIGHT hepatic lobe is
faintly evident on the noncontrast CT measuring 1.8 cm but intensely
hypermetabolic with SUV max equal 12.9.

Adrenal glands are normal. No hypermetabolic abdominopelvic
adenopathy

Incidental CT findings: Atherosclerotic calcification of the aorta.
Post hysterectomy

SKELETON: No focal hypermetabolic activity to suggest skeletal
metastasis.

Incidental CT findings: none
IMPRESSION: 1. Hypermetabolic LEFT lower lobe mass extending to the pleural
surfaces most consistent with primary bronchogenic carcinoma
2. Small pleural metastasis in the LEFT lower lobe.
3. Hypermetabolic ipsilateral and contralateral mediastinal nodal
metastasis.
4. Distant metastasis to the RIGHT upper lobe with a hypermetabolic
nodule.
5. Solid organ metastasis to the RIGHT hepatic lobe of the liver.
6. Aortic Atherosclerosis ([id number]-[id number]) and Emphysema ([id number]).

These results will be called to the ordering clinician or
representative by the Radiologist Assistant, and communication
documented in the PACS or zVision Dashboard.

## 2018-11-26 ENCOUNTER — Other Ambulatory Visit (HOSPITAL_COMMUNITY): Payer: Self-pay | Admitting: Family Medicine

## 2018-11-26 DIAGNOSIS — R2242 Localized swelling, mass and lump, left lower limb: Secondary | ICD-10-CM

## 2018-11-27 ENCOUNTER — Ambulatory Visit (HOSPITAL_COMMUNITY): Admission: RE | Admit: 2018-11-27 | Payer: Medicare Other | Source: Ambulatory Visit

## 2018-11-30 ENCOUNTER — Ambulatory Visit (HOSPITAL_COMMUNITY)
Admission: RE | Admit: 2018-11-30 | Discharge: 2018-11-30 | Disposition: A | Discharge status: Home / Self Care | Payer: Medicare Other | Source: Ambulatory Visit | Attending: Family Medicine | Admitting: Family Medicine

## 2018-11-30 DIAGNOSIS — M7989 Other specified soft tissue disorders: Secondary | ICD-10-CM | POA: Insufficient documentation

## 2018-11-30 DIAGNOSIS — R2242 Localized swelling, mass and lump, left lower limb: Secondary | ICD-10-CM

## 2019-01-03 IMAGING — US VENOUS DOPPLER ULTRASOUND OF LEFT LOWER EXTREMITY
1 series · 14 of 24 positions shown · non-contrast
Comparison: None.

CLINICAL DATA: Left lower extremity edema and pain for 1 week

EXAM:
LEFT LOWER EXTREMITY VENOUS DUPLEX ULTRASOUND
TECHNIQUE: Doppler venous assessment of the left lower extremity deep venous
system was performed, including characterization of spectral flow,
compressibility, and phasicity.

[Series 1: venous doppler ultrasound of left lower extremity · 0.06mm/px · 14 of 37 slices shown]
[im 1/37]
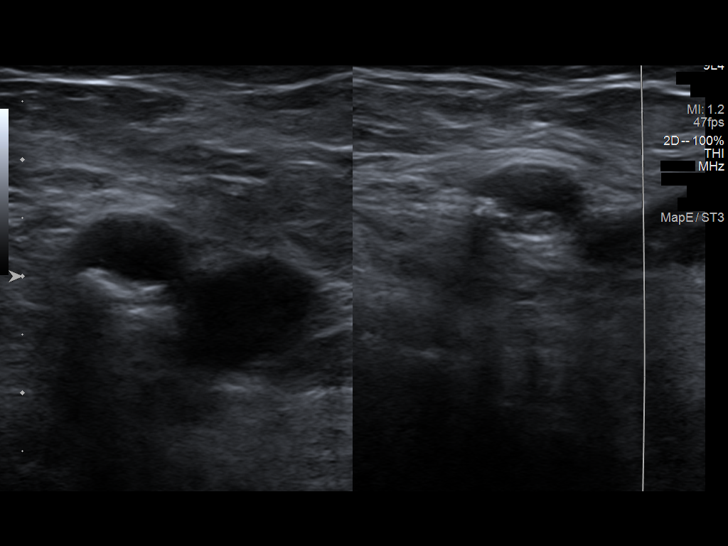
[im 4/37]
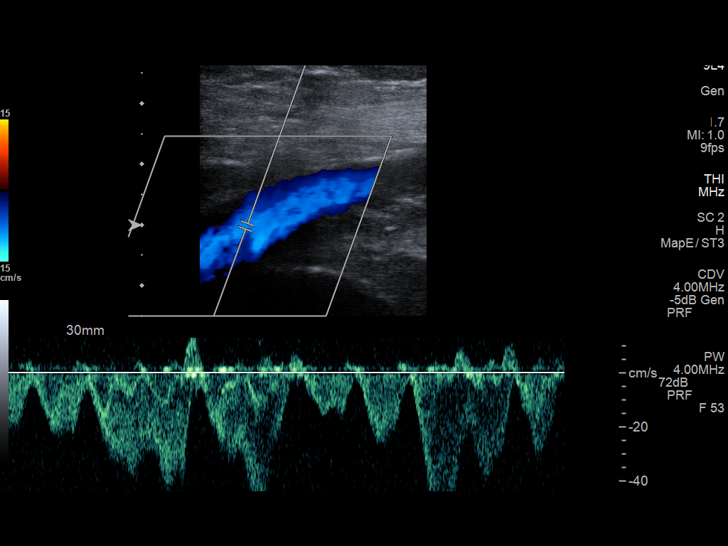
[im 7/37]
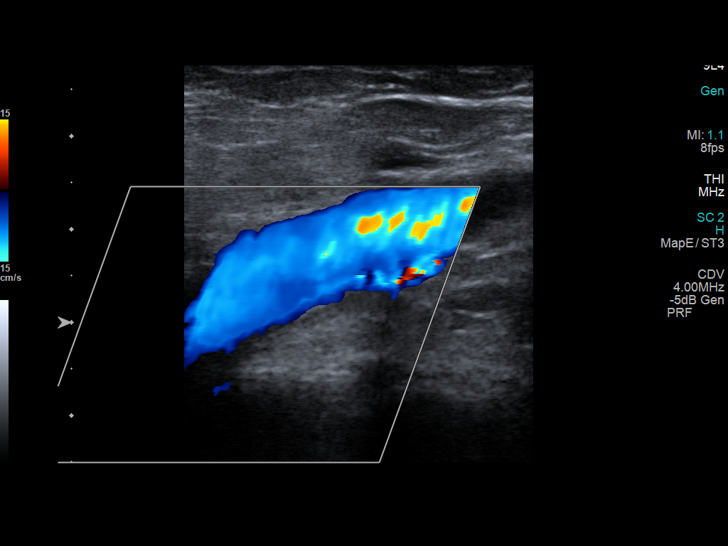
[im 10/37]
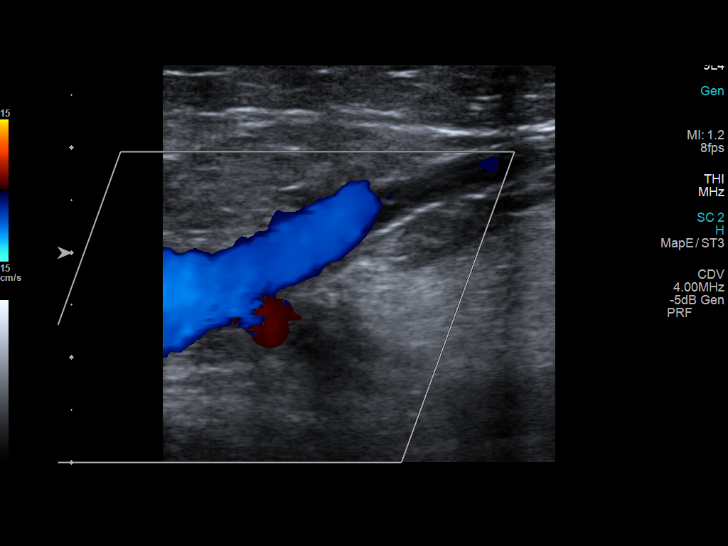
[im 11/37]
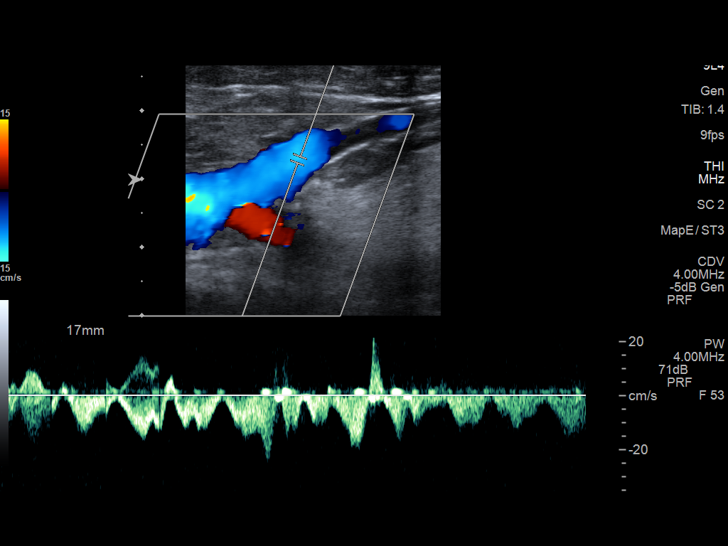
[im 15/37]
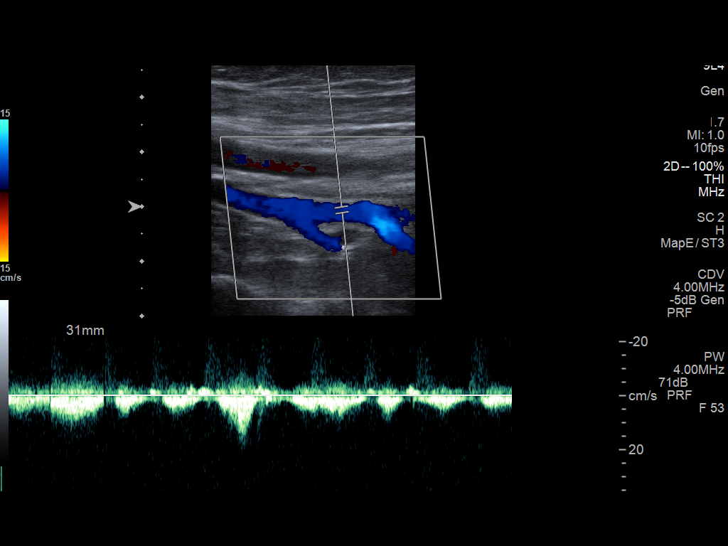
[im 18/37]
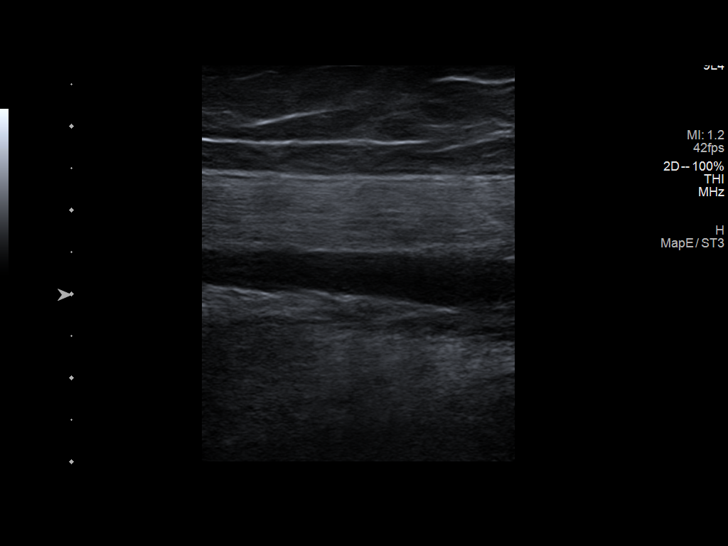
[im 19/37]
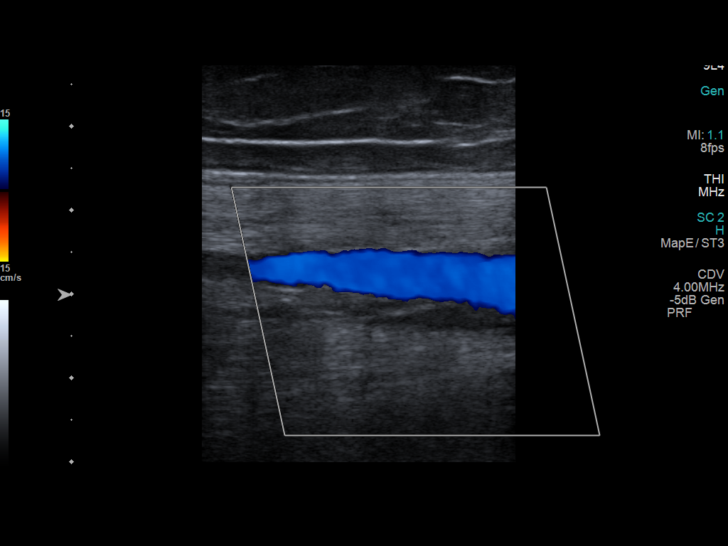
[im 22/37]
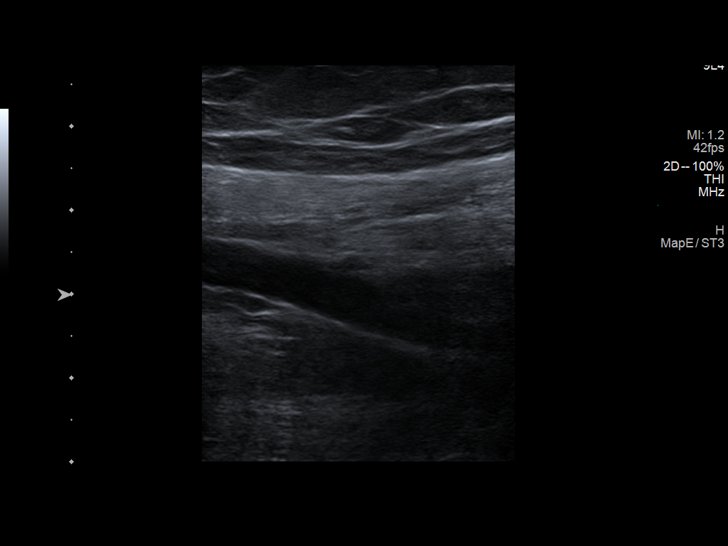
[im 26/37]
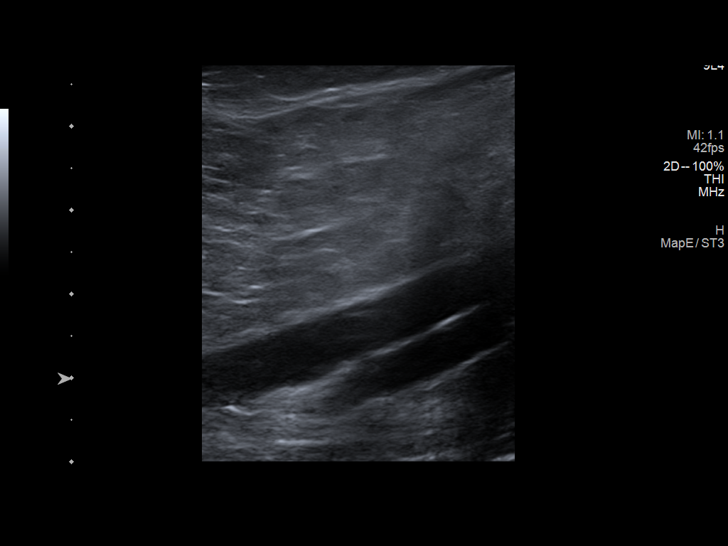
[im 29/37]
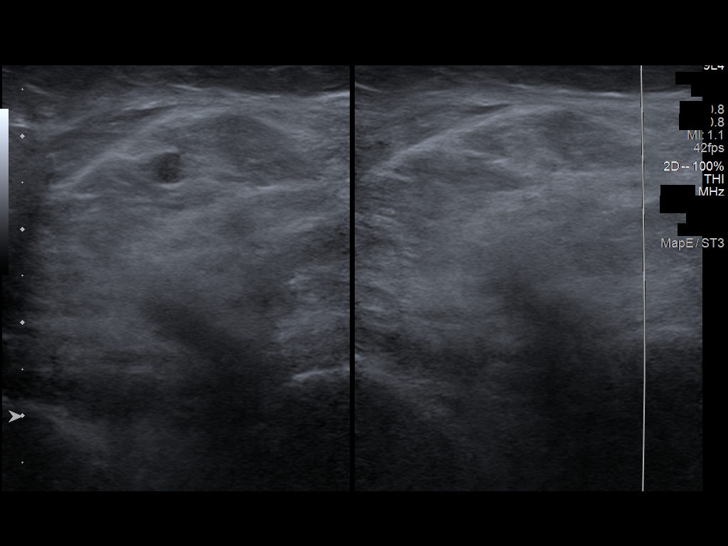
[im 30/37]
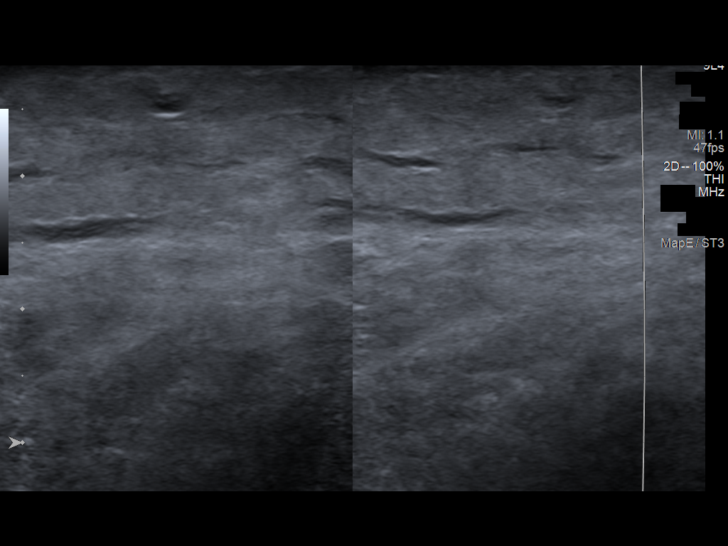
[im 33/37]
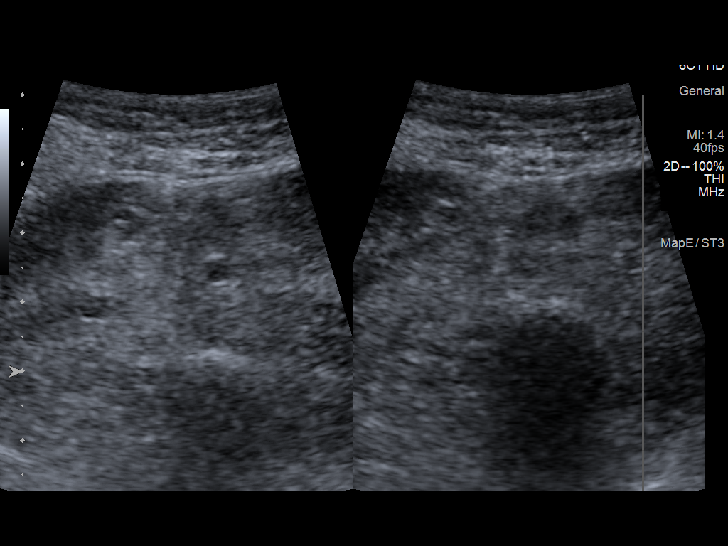
[im 37/37]
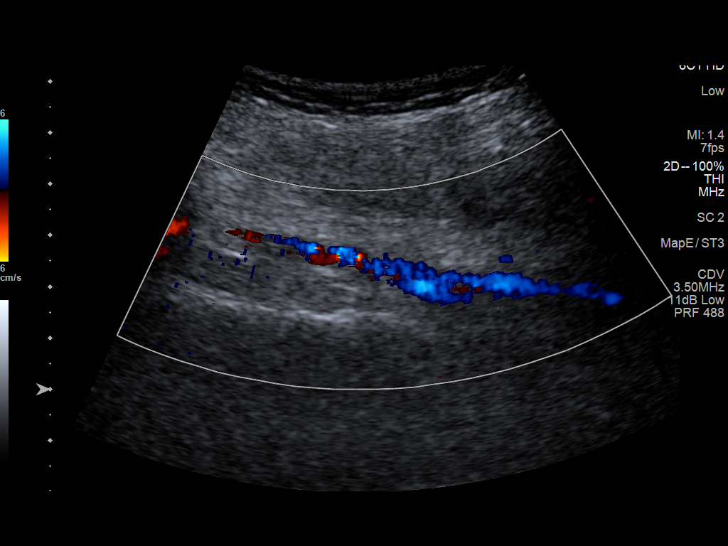

[14 of 24 positions shown; findings below may reference images not displayed]

FINDINGS: There is complete compressibility of the left common femoral,
femoral, and popliteal veins. Doppler analysis demonstrates
respiratory phasicity and augmentation of flow with calf
compression. No obvious superficial vein or calf vein thrombosis.
IMPRESSION: No evidence of left lower extremity DVT.

## 2019-05-24 DEATH — deceased
